# Patient Record
Sex: Female | Born: 1970 | Hispanic: Refuse to answer | Marital: Married
Health system: Midwestern US, Community
[De-identification: ages and names within clinical notes are randomized; demographics above are authoritative.]

## PROBLEM LIST (undated history)

## (undated) DIAGNOSIS — Z973 Presence of spectacles and contact lenses: Secondary | ICD-10-CM

## (undated) DIAGNOSIS — D649 Anemia, unspecified: Secondary | ICD-10-CM

## (undated) DIAGNOSIS — R011 Cardiac murmur, unspecified: Secondary | ICD-10-CM

## (undated) HISTORY — PX: LAPAROSCOPIC CHOLECYSTECTOMY: SUR755

## (undated) HISTORY — PX: SIGMOIDOSCOPY: SUR1295

## (undated) HISTORY — PX: UPPER GI ENDOSCOPY: SHX6162

## (undated) MED ORDER — CHOLESTYRAMINE-ASPARTAME 4 GRAM PACKET: 4 gram | PACK | Freq: Two times a day (BID) | ORAL | Status: AC

---

## 1999-08-25 ENCOUNTER — Encounter: Payer: Self-pay | Admitting: Occupational Medicine

## 1999-08-25 ENCOUNTER — Ambulatory Visit: Admission: RE | Admit: 1999-08-25 | Discharge: 1999-08-25 | Payer: Self-pay | Admitting: Occupational Medicine

## 1999-09-16 ENCOUNTER — Encounter: Admission: RE | Admit: 1999-09-16 | Discharge: 1999-09-29 | Payer: Self-pay | Admitting: Occupational Medicine

## 1999-11-02 ENCOUNTER — Other Ambulatory Visit: Admission: RE | Admit: 1999-11-02 | Discharge: 1999-11-02 | Payer: Self-pay | Admitting: Gynecology

## 2003-09-13 HISTORY — PX: HERNIA REPAIR: SHX51

## 2003-09-13 HISTORY — PX: UMBILICAL HERNIA REPAIR: SHX196

## 2013-06-07 NOTE — Progress Notes (Signed)
Karen Mitchell is a 42 y.o. female who presents with a   Chief Complaint   Patient presents with   ??? Pain Upper Quadrant   patient complains of right upper quadrant pain for the last 1 year.  Patient is status post laparoscopic cholecystectomy or 4 years ago since then patient has had increased bowel movements with some diarrhea up to 3 times a day at times.  She developed some right upper quadrant abdominal pain occasionally related to her food over last year or so that is intermittent in nature.  It is a dull ache.  She has recently only had 1 bowel movement per day and feels bloated at times.  She denies any heartburn.  She has no constipation or nausea or vomiting.  She has no weight loss.  She overall has a healthy diet does not drink alcohol or smoke cigarettes.    History reviewed. No pertinent past medical history.  Past Surgical History   Procedure Laterality Date   ??? Hx cholecystectomy       History     Social History   ??? Marital Status: MARRIED     Spouse Name: N/A     Number of Children: N/A   ??? Years of Education: N/A     Occupational History   ??? Not on file.     Social History Main Topics   ??? Smoking status: Never Smoker    ??? Smokeless tobacco: Not on file   ??? Alcohol Use: Not on file   ??? Drug Use: Not on file   ??? Sexually Active: Not on file     Other Topics Concern   ??? Not on file     Social History Narrative   ??? No narrative on file     History reviewed. No pertinent family history.  Current Outpatient Prescriptions   Medication Sig   ??? cholestyramine light (QUESTRAN LIGHT) 4 gram packet Take 1 packet by mouth two (2) times a day. As needed     No current facility-administered medications for this visit.     No Known Allergies    Review of Systems:Negative except for HPI.      Filed Vitals:    06/07/13 1130   BP: 110/70   Pulse: 69   Resp: 18   Height: 5\' 3"  (1.6 m)   Weight: 126 lb (57.153 kg)   SpO2: 98%     Body mass index is 22.33 kg/(m^2).    P/E    Psychiatric: Alert and Oriented to person,  place, and time, mood-euthymic, normal affect  Ear,Nose,Mouth,Throat:  Neck is supple, No adenopathy, TM's no pathology, oropharynx-no abnormalities  Lymphatic-No neck adenopathy, no supraclavicular adenopathy  Cardiovascular:  Regular, rate and rhythm, no murmurs, rubs or gallops, no lower extremity edema, no carotid bruits  Respiratory: normal respiratory effort,  Clear to ausculation bilateral  Abdomen: soft, + slight point tenderness with slight increased tissue lateral to laparoscopic incision, not distended, normoactive bowel sounds, no hepatomegaly, no splenomegaly  Musculoskelatal:normal gait and station, No clubbing cyanosis, or inflamatory conditions     A/P  Check ultrasound of abdomen to rule out any retained stones versus common bile duct.  Most likely adhesions from previous surgery causing pain if IS NEGATIVE REFER BACK TO HER SURGEON FOR FURTHER EVALUATION.  Diarrhea trial of Questran as needed  Orders Placed This Encounter   ??? [place] RETROPERITONEUM COMP [id number]     Standing Status: Future      Number of Occurrences:  Standing Expiration Date: 07/07/2014     Order Specific Question:  Reason for Exam     Answer:  ruq pain s/p lap chole 4 years ago     Order Specific Question:  Is Patient Pregnant?     Answer:  Unknown   ??? cholestyramine light (QUESTRAN LIGHT) 4 gram packet     Sig: Take 1 packet by mouth two (2) times a day. As needed     Dispense:  60 packet     Refill:  0     Follow-up Disposition:  Return in about 2 weeks (around 06/21/2013).  Shawn Stall, MD

## 2014-08-21 ENCOUNTER — Inpatient Hospital Stay
Admit: 2014-08-21 | Discharge: 2014-08-21 | Disposition: A | Discharge status: Home / Self Care | Payer: PRIVATE HEALTH INSURANCE | Attending: Internal Medicine

## 2014-08-21 ENCOUNTER — Emergency Department: Admit: 2014-08-21 | Payer: PRIVATE HEALTH INSURANCE | Primary: Internal Medicine

## 2014-08-21 DIAGNOSIS — R0600 Dyspnea, unspecified: Secondary | ICD-10-CM

## 2014-08-21 LAB — CBC WITH AUTOMATED DIFF
ABS. BASOPHILS: 0.1 10*3/uL (ref 0.0–0.1)
ABS. EOSINOPHILS: 0.1 10*3/uL (ref 0.0–0.7)
ABS. LYMPHOCYTES: 1.5 10*3/uL (ref 1.5–3.5)
ABS. MONOCYTES: 0.3 10*3/uL (ref 0.0–1.0)
ABS. NEUTROPHILS: 2.8 10*3/uL (ref 1.5–6.6)
BASOPHILS: 1 % (ref 0.0–3.0)
EOSINOPHILS: 1 % (ref 0.0–7.0)
HCT: 35.6 % — ABNORMAL LOW (ref 36.0–47.0)
HGB: 11.7 g/dL — ABNORMAL LOW (ref 12.0–16.0)
IMMATURE GRANULOCYTES: 0 % (ref 0.0–2.0)
LYMPHOCYTES: 32 % (ref 18.0–40.0)
MCH: 29.8 PG (ref 27.0–35.0)
MCHC: 32.9 g/dL (ref 30.7–37.3)
MCV: 90.6 FL (ref 81.0–94.0)
MONOCYTES: 6 % (ref 2.0–12.0)
MPV: 10.3 FL (ref 9.2–11.8)
NEUTROPHILS: 60 % (ref 48.0–72.0)
PLATELET: 324 10*3/uL (ref 130–400)
RBC: 3.93 M/uL — ABNORMAL LOW (ref 4.20–5.40)
RDW: 12.2 % (ref 11.5–14.0)
WBC: 4.7 10*3/uL — ABNORMAL LOW (ref 4.8–10.6)

## 2014-08-21 LAB — MAGNESIUM: Magnesium: 1.5 mg/dL — ABNORMAL LOW (ref 1.8–2.4)

## 2014-08-21 LAB — HCG URINE, QL: HCG urine, QL: NEGATIVE

## 2014-08-21 LAB — D DIMER: D-dimer: <500 ng/mL (ref <500)

## 2014-08-21 LAB — TROPONIN I: Troponin-I, Qt.: <0.02 NG/ML (ref 0.00–0.09)

## 2014-08-21 LAB — D-DIMER, QUANTITATIVE: D-Dimer, Quant: <500 ng/mL (ref <500)

## 2014-08-21 MED ORDER — ASPIRIN 81 MG CHEWABLE TAB
81 mg | ORAL | Status: DC
Start: 2014-08-21 — End: 2014-08-21

## 2014-08-21 NOTE — ED Notes (Signed)
Dr. Maudie Mercury in to evaluate the patient.

## 2014-08-21 NOTE — ED Notes (Signed)
Pt states the past few days her blood pressure has been elevated and SOB at times. Also today noted her rt arm was swollen and squeezing pain at times to arm. Pt sent in from urgent care for evaluation.

## 2014-08-21 NOTE — ED Provider Notes (Signed)
The history is provided by the patient.      2:04 PM: Karen Mitchell is a 43 y.o. female with no PMHx who presents to the ED c/o progressively worsening SOB when she walks over the past few days and reports elevated BP for the past week around 138/86 increased from her baseline 110/60. She also reports squeezing non-radiating R arm pain which began today. She was seen for her sx at an Urgent Care today where she had an EKG and she was sent here for abnormal findings. She admits to being under heavy stress at work. She denies CP, palpitations, N/V and reports no further complaints at this time.      History reviewed. No pertinent past medical history.    Past Surgical History:   Procedure Laterality Date   ??? Hx cholecystectomy           History reviewed. No pertinent family history.    History     Social History   ??? Marital Status: MARRIED     Spouse Name: N/A     Number of Children: N/A   ??? Years of Education: N/A     Occupational History   ??? Not on file.     Social History Main Topics   ??? Smoking status: Never Smoker    ??? Smokeless tobacco: Not on file   ??? Alcohol Use: Not on file   ??? Drug Use: Not on file   ??? Sexual Activity: Not on file     Other Topics Concern   ??? Not on file     Social History Narrative                ALLERGIES: Review of patient's allergies indicates no known allergies.      Review of Systems   Constitutional: Negative for fever and chills.   HENT: Negative for rhinorrhea and sore throat.    Eyes: Negative for photophobia and visual disturbance.   Respiratory: Positive for shortness of breath (when walking). Negative for cough.    Cardiovascular: Negative for chest pain, palpitations and leg swelling.   Gastrointestinal: Negative for nausea, vomiting, abdominal pain and diarrhea.   Genitourinary: Negative for dysuria and hematuria.   Musculoskeletal: Negative for myalgias and back pain.        Positive for R arm pain.   Skin: Negative for color change and pallor.    Neurological: Negative for seizures and syncope.       Filed Vitals:    08/21/14 1313 08/21/14 1434 08/21/14 1515 08/21/14 1530   BP: 153/88 121/72 114/69 124/65   Pulse: 105 83 79 82   Temp: 98.4 ??F (36.9 ??C)      Resp: 20 20 18 16    Height: 5' 3"  (1.6 m)      Weight: 58.968 kg (130 lb)      SpO2: 100% 100% 100% 100%        1:13 PM Pulse Oximetry reading is 100 % on room air, which indicates normal oxygenation per Vergia Alberts, MD.         Physical Exam   Constitutional: She is oriented to person, place, and time. She appears well-developed and well-nourished. No distress.   HENT:   Head: Normocephalic and atraumatic.   Eyes: EOM are normal. Pupils are equal, round, and reactive to light.   Neck: Neck supple. No JVD present.   Cardiovascular: Normal rate, regular rhythm and normal heart sounds.    Pulmonary/Chest: Effort normal and breath  sounds normal. No respiratory distress. She has no wheezes. She has no rales.   Abdominal: Soft. Bowel sounds are normal. She exhibits no distension. There is no tenderness. There is no rebound and no guarding.   Musculoskeletal: Normal range of motion. She exhibits no edema or tenderness.   Neurological: She is alert and oriented to person, place, and time.   Skin: Skin is warm and dry.   Psychiatric: She has a normal mood and affect.   Nursing note and vitals reviewed.       MDM  Number of Diagnoses or Management Options  Dyspnea:      Amount and/or Complexity of Data Reviewed  Clinical lab tests: ordered and reviewed  Tests in the radiology section of CPT??: reviewed and ordered  Decide to obtain previous medical records or to obtain history from someone other than the patient: yes  Review and summarize past medical records: yes  Independent visualization of images, tracings, or specimens: yes        Procedures      I, Vergia Alberts, MD, reviewed the patient's past history, allergies and home medications as documented in the nursing chart.    Labs:   Recent Results (from the past 12 hour(s))   EKG, 12 LEAD, INITIAL    Collection Time: 08/21/14  2:05 PM   Result Value Ref Range    Ventricular Rate 84 BPM    Atrial Rate 84 BPM    P-R Interval 149 ms    QRS Duration 98 ms    Q-T Interval 391 ms    QTC Calculation (Bezet) 462 ms    Calculated P Axis 88 degrees    Calculated R Axis 82 degrees    Calculated T Axis 27 degrees    Diagnosis       Sinus rhythm  Probable left atrial enlargement  Borderline T wave abnormalities     HCG URINE, QL    Collection Time: 08/21/14  2:15 PM   Result Value Ref Range    HCG urine, Ql. NEGATIVE  NEG     CBC WITH AUTOMATED DIFF    Collection Time: 08/21/14  2:30 PM   Result Value Ref Range    WBC 4.7 (L) 4.8 - 10.6 K/uL    RBC 3.93 (L) 4.20 - 5.40 M/uL    HGB 11.7 (L) 12.0 - 16.0 g/dL    HCT 35.6 (L) 36.0 - 47.0 %    MCV 90.6 81.0 - 94.0 FL    MCH 29.8 27.0 - 35.0 PG    MCHC 32.9 30.7 - 37.3 g/dL    RDW 12.2 11.5 - 14.0 %    PLATELET 324 130 - 400 K/uL    MPV 10.3 9.2 - 11.8 FL    NEUTROPHILS 60 48.0 - 72.0 %    LYMPHOCYTES 32 18.0 - 40.0 %    MONOCYTES 6 2.0 - 12.0 %    EOSINOPHILS 1 0.0 - 7.0 %    BASOPHILS 1 0.0 - 3.0 %    ABS. NEUTROPHILS 2.8 1.5 - 6.6 K/UL    ABS. LYMPHOCYTES 1.5 1.5 - 3.5 K/UL    ABS. MONOCYTES 0.3 0.0 - 1.0 K/UL    ABS. EOSINOPHILS 0.1 0.0 - 0.7 K/UL    ABS. BASOPHILS 0.1 0.0 - 0.1 K/UL    DF AUTOMATED      IMMATURE GRANULOCYTES 0.0 0.0 - 2.0 %   TROPONIN I    Collection Time: 08/21/14  2:30 PM   Result Value  Ref Range    Troponin-I, Qt. <0.02 0.00 - 0.09 NG/ML   MAGNESIUM    Collection Time: 08/21/14  2:30 PM   Result Value Ref Range    Magnesium 1.5 (L) 1.8 - 2.4 mg/dL   D DIMER    Collection Time: 08/21/14  2:30 PM   Result Value Ref Range    D-dimer <500 <500 ng/mL       EKG:  2:13 PM HR 84 NSR no acute ST changes  Interpreted by Vergia Alberts, MD    Imaging:   CXR Results  (Last 48 hours)               08/21/14 1507  XR CHEST PA LAT Final result    Impression:  IMPRESSION:    No focal infiltrate or effusion            Narrative:  History: chest pain       Technique: PA and Lateral views of the chest 08/21/2014 3:07 PM       Comparison: none.        FINDINGS:       No focal infiltrate or effusion is identified.    The cardiac silhouette is unremarkable.   The visualized osseous structures appear unremarkable.               Radiology reviewed by Vergia Alberts, MD       <EMERGENCY DEPARTMENT CASE SUMMARY>    Impression/Differential Diagnosis: anxiety vs dyspnea    ED Course:   Pt is a 43 y.o. female who presents to the ED for SOB when walking and R arm pain.  Order cardiac workup.   Labs and imaging reviewed. No acute pathology. Will discharge.  4:15 PM Patient was reassessed prior to discharge. I personally discussed test results and discharge plan with patient/guardian, who understands instructions.  All questions were answered. Patient/guardian advised to follow up with PMD and/or specialist.  Patient/guardian instructed to return to the ER if symptoms persist, change, or worsen.  Patient agrees with plan.    Final Impression/Diagnosis:   1. Dyspnea        Patient condition at time of disposition: Stable    I have reviewed the following home medications:    Prior to Admission medications    Medication Sig Start Date End Date Taking? Authorizing Provider   cholestyramine light (QUESTRAN LIGHT) 4 gram packet Take 1 packet by mouth two (2) times a day. As needed 06/07/13   Brunetta Genera, MD       Vergia Alberts, MD     I, Rande Brunt, am serving as a scribe to document services personally performed by Vergia Alberts, MD based on my observation and the provider's statements to me.    I, Vergia Alberts, MD attest that the person(s) noted above, acting as my scribe(s) noted above, has observed my performance of the services and has documented them in accordance with my direction. I have personally reviewed the above information and have ordered and reviewed the diagnostic studies, unless otherwise noted.

## 2014-08-21 NOTE — ED Notes (Signed)
I have reviewed discharge instructions with the patient.  The patient verbalized understanding.

## 2014-08-22 LAB — EKG, 12 LEAD, INITIAL
Atrial Rate: 84 {beats}/min
Calculated P Axis: 88 degrees
Calculated R Axis: 82 degrees
Calculated T Axis: 27 degrees
P-R Interval: 149 ms
Q-T Interval: 391 ms
QRS Duration: 98 ms
QTC Calculation (Bezet): 462 ms
Ventricular Rate: 84 {beats}/min

## 2017-01-13 ENCOUNTER — Ambulatory Visit (INDEPENDENT_AMBULATORY_CARE_PROVIDER_SITE_OTHER): Payer: No Typology Code available for payment source | Admitting: Obstetrics & Gynecology

## 2017-01-13 ENCOUNTER — Encounter (HOSPITAL_COMMUNITY): Payer: Self-pay

## 2017-01-13 ENCOUNTER — Encounter: Payer: Self-pay | Admitting: Obstetrics & Gynecology

## 2017-01-13 VITALS — BP 136/80 | HR 79 | Ht 62.0 in | Wt 129.0 lb

## 2017-01-13 DIAGNOSIS — N920 Excessive and frequent menstruation with regular cycle: Secondary | ICD-10-CM | POA: Diagnosis not present

## 2017-01-13 NOTE — Progress Notes (Signed)
Here today for evaluation of rt ovarian cyst and fibroids.  Last pap smear 2016.  Just moved here from [place]. No history of any abnormal pap smears.

## 2017-01-13 NOTE — Progress Notes (Signed)
   Subjective:    Patient ID: Erika Sloan, female    DOB: Jan 24, 1971, 46 y.o.   MRN: [id number]  HPI 46 yo M AAP3 (25, 56, and 33 yo kids) here today with u/s findings of a right ovarian cyst of 4.2 cm, thought to be a follicle, and a 2.9 cm on the left ovary. U/ s also showed 2 fibroids, one 2 cm and one 3.8 cm. She has right leg numbness, starting with the first day of her period for the last about 3 years. Her periods are very painful, passes clots for about 3 years. She is taking Naproxyn, helps. IBU 450m helps a little. She has some pain with sex, postitional, not all the time. Her husband has a vasectomy. She has to wear the overnight pads during the day, even due to the heaviness of her periods.  Review of Systems [place] Pap was 2 years ago and normal TSH was normal in the last year.   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 Objective:   Physical Exam Pleasant [id number] Breathing, conversing, and ambulating normally Abd- benign Bimanual exam- 8 week size mobile, NT uterus, no adnexal masses Excellent pubic arch if she needs a TVH     Assessment & Plan:  Heavy periods with clots- check CBC, she takes iron, always anemic Severe dysmenorrhea- offered emperic treatment for endometriosis versus diagnostic laparoscopy. She would like a definitive diagnosis so I will send JJordana message to schedule this.

## 2017-01-14 LAB — CBC
Hematocrit: 34.2 % (ref 34.0–46.6)
Hemoglobin: 11.4 g/dL (ref 11.1–15.9)
MCH: 30.5 pg (ref 26.6–33.0)
MCHC: 33.3 g/dL (ref 31.5–35.7)
MCV: 91 fL (ref 79–97)
Platelets: 326 10*3/uL (ref 150–379)
RBC: 3.74 x10E6/uL — ABNORMAL LOW (ref 3.77–5.28)
RDW: 12.8 % (ref 12.3–15.4)
WBC: 3.8 10*3/uL (ref 3.4–10.8)

## 2017-02-02 NOTE — Patient Instructions (Signed)
Your procedure is scheduled on:  Wednesday, February 15, 2017  Enter through the [employer] [facility] at:  7:00 AM  Pick up the phone at the desk and dial [phone].  Call this number if you have problems the morning of surgery: [phone].  Remember: Do NOT eat food or drink after:  Midnight Tuesday  Take these medicines the morning of surgery with a SIP OF WATER:  None  Stop ALL herbal medications and Vitamin C at this time  Do NOT smoke the day of surgery.  Do NOT wear jewelry (body piercing), metal hair clips/bobby pins, make-up, or nail polish. Do NOT wear lotions, powders, or perfumes.  You may wear deodorant. Do NOT shave for 48 hours prior to surgery. Do NOT bring valuables to the hospital. Contacts, dentures, or bridgework may not be worn into surgery.  Have a responsible adult drive you home and stay with you for 24 hours after your procedure  Bring a copy of your healthcare power of attorney and living will documents.

## 2017-02-03 ENCOUNTER — Encounter (HOSPITAL_COMMUNITY)
Admission: RE | Admit: 2017-02-03 | Discharge: 2017-02-03 | Disposition: A | Discharge status: Home / Self Care | Payer: Commercial Managed Care - HMO | Source: Ambulatory Visit | Attending: Obstetrics & Gynecology | Admitting: Obstetrics & Gynecology

## 2017-02-03 ENCOUNTER — Encounter (HOSPITAL_COMMUNITY): Payer: Self-pay

## 2017-02-03 DIAGNOSIS — Z01812 Encounter for preprocedural laboratory examination: Secondary | ICD-10-CM | POA: Diagnosis not present

## 2017-02-03 HISTORY — DX: Anemia, unspecified: D64.9

## 2017-02-03 HISTORY — DX: Cardiac murmur, unspecified: R01.1

## 2017-02-03 LAB — CBC
HCT: 35.9 % — ABNORMAL LOW (ref 36.0–46.0)
Hemoglobin: 12.1 g/dL (ref 12.0–15.0)
MCH: 30.3 pg (ref 26.0–34.0)
MCHC: 33.7 g/dL (ref 30.0–36.0)
MCV: 90 fL (ref 78.0–100.0)
Platelets: 325 10*3/uL (ref 150–400)
RBC: 3.99 MIL/uL (ref 3.87–5.11)
RDW: 13 % (ref 11.5–15.5)
WBC: 5.5 10*3/uL (ref 4.0–10.5)

## 2017-02-15 ENCOUNTER — Ambulatory Visit (HOSPITAL_COMMUNITY)
Admission: RE | Admit: 2017-02-15 | Discharge: 2017-02-15 | Disposition: A | Discharge status: Home / Self Care | Payer: Commercial Managed Care - HMO | Source: Ambulatory Visit | Attending: Obstetrics & Gynecology | Admitting: Obstetrics & Gynecology

## 2017-02-15 ENCOUNTER — Ambulatory Visit (HOSPITAL_COMMUNITY): Payer: Commercial Managed Care - HMO | Admitting: Anesthesiology

## 2017-02-15 ENCOUNTER — Encounter (HOSPITAL_COMMUNITY)
Admission: RE | Disposition: A | Discharge status: Home / Self Care | Payer: Self-pay | Source: Ambulatory Visit | Attending: Obstetrics & Gynecology

## 2017-02-15 ENCOUNTER — Encounter (HOSPITAL_COMMUNITY): Payer: Self-pay | Admitting: *Deleted

## 2017-02-15 DIAGNOSIS — Z8249 Family history of ischemic heart disease and other diseases of the circulatory system: Secondary | ICD-10-CM | POA: Diagnosis not present

## 2017-02-15 DIAGNOSIS — R102 Pelvic and perineal pain: Secondary | ICD-10-CM | POA: Insufficient documentation

## 2017-02-15 DIAGNOSIS — N938 Other specified abnormal uterine and vaginal bleeding: Secondary | ICD-10-CM | POA: Diagnosis present

## 2017-02-15 DIAGNOSIS — N946 Dysmenorrhea, unspecified: Secondary | ICD-10-CM | POA: Insufficient documentation

## 2017-02-15 DIAGNOSIS — Z9049 Acquired absence of other specified parts of digestive tract: Secondary | ICD-10-CM | POA: Diagnosis not present

## 2017-02-15 DIAGNOSIS — N920 Excessive and frequent menstruation with regular cycle: Secondary | ICD-10-CM | POA: Insufficient documentation

## 2017-02-15 DIAGNOSIS — Z885 Allergy status to narcotic agent status: Secondary | ICD-10-CM | POA: Insufficient documentation

## 2017-02-15 DIAGNOSIS — N8500 Endometrial hyperplasia, unspecified: Secondary | ICD-10-CM | POA: Insufficient documentation

## 2017-02-15 DIAGNOSIS — Z803 Family history of malignant neoplasm of breast: Secondary | ICD-10-CM | POA: Diagnosis not present

## 2017-02-15 DIAGNOSIS — D649 Anemia, unspecified: Secondary | ICD-10-CM | POA: Insufficient documentation

## 2017-02-15 DIAGNOSIS — D250 Submucous leiomyoma of uterus: Secondary | ICD-10-CM

## 2017-02-15 DIAGNOSIS — Z9889 Other specified postprocedural states: Secondary | ICD-10-CM | POA: Diagnosis not present

## 2017-02-15 HISTORY — PX: DILITATION & CURRETTAGE/HYSTROSCOPY WITH HYDROTHERMAL ABLATION: SHX5570

## 2017-02-15 HISTORY — PX: LAPAROSCOPY: SHX197

## 2017-02-15 LAB — PREGNANCY, URINE: Preg Test, Ur: NEGATIVE

## 2017-02-15 SURGERY — LAPAROSCOPY OPERATIVE
Anesthesia: General

## 2017-02-15 MED ORDER — SUGAMMADEX SODIUM 200 MG/2ML IV SOLN
INTRAVENOUS | Status: DC | PRN
  Administered 2017-02-15: 119 mg via INTRAVENOUS

## 2017-02-15 MED ORDER — SCOPOLAMINE 1 MG/3DAYS TD PT72
MEDICATED_PATCH | TRANSDERMAL | Status: AC
  Filled 2017-02-15: qty 1

## 2017-02-15 MED ORDER — LIDOCAINE HCL (CARDIAC) 20 MG/ML IV SOLN
INTRAVENOUS | Status: AC
  Filled 2017-02-15: qty 5

## 2017-02-15 MED ORDER — FLUMAZENIL 0.5 MG/5ML IV SOLN
INTRAVENOUS | Status: DC | PRN
  Administered 2017-02-15: 0.2 mg via INTRAVENOUS

## 2017-02-15 MED ORDER — IBUPROFEN 600 MG PO TABS: 600.0000 mg | ORAL_TABLET | Freq: Four times a day (QID) | ORAL | 1 refills | Status: DC | PRN

## 2017-02-15 MED ORDER — PROMETHAZINE HCL 25 MG/ML IJ SOLN
INTRAMUSCULAR | Status: AC
  Filled 2017-02-15: qty 1

## 2017-02-15 MED ORDER — BUPIVACAINE HCL (PF) 0.5 % IJ SOLN
INTRAMUSCULAR | Status: AC
Start: 2017-02-15 — End: ?
  Filled 2017-02-15: qty 30

## 2017-02-15 MED ORDER — FLUMAZENIL 0.5 MG/5ML IV SOLN
INTRAVENOUS | Status: AC
  Filled 2017-02-15: qty 5

## 2017-02-15 MED ORDER — DEXAMETHASONE SODIUM PHOSPHATE 10 MG/ML IJ SOLN
INTRAMUSCULAR | Status: DC | PRN
  Administered 2017-02-15: 4 mg via INTRAVENOUS

## 2017-02-15 MED ORDER — BUPIVACAINE HCL (PF) 0.5 % IJ SOLN
INTRAMUSCULAR | Status: AC
  Filled 2017-02-15: qty 30

## 2017-02-15 MED ORDER — SUGAMMADEX SODIUM 200 MG/2ML IV SOLN
INTRAVENOUS | Status: AC
  Filled 2017-02-15: qty 2

## 2017-02-15 MED ORDER — SODIUM CHLORIDE 0.9 % IR SOLN
Status: DC | PRN
  Administered 2017-02-15: 3000 mL

## 2017-02-15 MED ORDER — KETOROLAC TROMETHAMINE 30 MG/ML IJ SOLN
INTRAMUSCULAR | Status: DC | PRN
  Administered 2017-02-15: 30 mg via INTRAVENOUS

## 2017-02-15 MED ORDER — ONDANSETRON HCL 4 MG/2ML IJ SOLN
INTRAMUSCULAR | Status: AC
  Filled 2017-02-15: qty 2

## 2017-02-15 MED ORDER — PROPOFOL 10 MG/ML IV BOLUS
INTRAVENOUS | Status: AC
  Filled 2017-02-15: qty 20

## 2017-02-15 MED ORDER — OXYCODONE-ACETAMINOPHEN 5-325 MG PO TABS: 1.0000 | ORAL_TABLET | Freq: Four times a day (QID) | ORAL | 0 refills | Status: DC | PRN

## 2017-02-15 MED ORDER — KETOROLAC TROMETHAMINE 30 MG/ML IJ SOLN
INTRAMUSCULAR | Status: AC
  Filled 2017-02-15: qty 1

## 2017-02-15 MED ORDER — ROCURONIUM BROMIDE 100 MG/10ML IV SOLN
INTRAVENOUS | Status: DC | PRN
  Administered 2017-02-15: 35 mg via INTRAVENOUS

## 2017-02-15 MED ORDER — PROPOFOL 10 MG/ML IV BOLUS
INTRAVENOUS | Status: DC | PRN
  Administered 2017-02-15: 180 mg via INTRAVENOUS

## 2017-02-15 MED ORDER — LIDOCAINE HCL (CARDIAC) 20 MG/ML IV SOLN
INTRAVENOUS | Status: DC | PRN
  Administered 2017-02-15: 30 mg via INTRAVENOUS
  Administered 2017-02-15: 70 mg via INTRAVENOUS

## 2017-02-15 MED ORDER — BUPIVACAINE HCL (PF) 0.5 % IJ SOLN
INTRAMUSCULAR | Status: DC | PRN
  Administered 2017-02-15: 14 mL

## 2017-02-15 MED ORDER — DEXAMETHASONE SODIUM PHOSPHATE 4 MG/ML IJ SOLN
INTRAMUSCULAR | Status: AC
  Filled 2017-02-15: qty 1

## 2017-02-15 MED ORDER — SCOPOLAMINE 1 MG/3DAYS TD PT72
1.0000 | MEDICATED_PATCH | Freq: Once | TRANSDERMAL | Status: DC
  Administered 2017-02-15: 1.5 mg via TRANSDERMAL

## 2017-02-15 MED ORDER — MIDAZOLAM HCL 2 MG/2ML IJ SOLN
INTRAMUSCULAR | Status: DC | PRN
  Administered 2017-02-15: 1 mg via INTRAVENOUS

## 2017-02-15 MED ORDER — HYDROMORPHONE HCL 1 MG/ML IJ SOLN
0.2500 mg | INTRAMUSCULAR | Status: DC | PRN
  Administered 2017-02-15: 0.5 mg via INTRAVENOUS
  Administered 2017-02-15 (×2): 0.25 mg via INTRAVENOUS

## 2017-02-15 MED ORDER — HYDROMORPHONE HCL 1 MG/ML IJ SOLN
INTRAMUSCULAR | Status: AC
  Filled 2017-02-15: qty 1

## 2017-02-15 MED ORDER — ONDANSETRON HCL 4 MG/2ML IJ SOLN
INTRAMUSCULAR | Status: DC | PRN
  Administered 2017-02-15: 4 mg via INTRAVENOUS

## 2017-02-15 MED ORDER — ROCURONIUM BROMIDE 100 MG/10ML IV SOLN
INTRAVENOUS | Status: AC
Start: 2017-02-15 — End: ?
  Filled 2017-02-15: qty 1

## 2017-02-15 MED ORDER — MEPERIDINE HCL 25 MG/ML IJ SOLN: 6.2500 mg | INTRAMUSCULAR | Status: DC | PRN

## 2017-02-15 MED ORDER — MIDAZOLAM HCL 2 MG/2ML IJ SOLN: 0.5000 mg | Freq: Once | INTRAMUSCULAR | Status: DC | PRN

## 2017-02-15 MED ORDER — LACTATED RINGERS IV SOLN
INTRAVENOUS | Status: DC
  Administered 2017-02-15: 1000 mL via INTRAVENOUS
  Administered 2017-02-15: 09:00:00 via INTRAVENOUS

## 2017-02-15 MED ORDER — PROMETHAZINE HCL 25 MG/ML IJ SOLN
6.2500 mg | INTRAMUSCULAR | Status: DC | PRN
Start: 2017-02-15 — End: 2017-02-15
  Administered 2017-02-15: 6.25 mg via INTRAVENOUS

## 2017-02-15 MED ORDER — FENTANYL CITRATE (PF) 250 MCG/5ML IJ SOLN
INTRAMUSCULAR | Status: AC
  Filled 2017-02-15: qty 5

## 2017-02-15 MED ORDER — MIDAZOLAM HCL 2 MG/2ML IJ SOLN
INTRAMUSCULAR | Status: AC
  Filled 2017-02-15: qty 2

## 2017-02-15 MED ORDER — FENTANYL CITRATE (PF) 100 MCG/2ML IJ SOLN
INTRAMUSCULAR | Status: DC | PRN
  Administered 2017-02-15 (×2): 50 ug via INTRAVENOUS

## 2017-02-15 SURGICAL SUPPLY — 43 items
APPLICATOR COTTON TIP 6IN STRL (MISCELLANEOUS) ×2 IMPLANT
BAG SPEC RTRVL LRG 6X4 10 (ENDOMECHANICALS)
CABLE HIGH FREQUENCY MONO STRZ (ELECTRODE) IMPLANT
CANISTER SUCT 3000ML PPV (MISCELLANEOUS) ×2 IMPLANT
CLOTH BEACON ORANGE TIMEOUT ST (SAFETY) ×2 IMPLANT
CONTAINER PREFILL 10% NBF 60ML (FORM) ×4 IMPLANT
DILATOR CANAL MILEX (MISCELLANEOUS) IMPLANT
DRSG COVADERM PLUS 2X2 (GAUZE/BANDAGES/DRESSINGS) ×2 IMPLANT
DRSG OPSITE POSTOP 3X4 (GAUZE/BANDAGES/DRESSINGS) ×1 IMPLANT
DURAPREP 26ML APPLICATOR (WOUND CARE) ×2 IMPLANT
ELECT REM PT RETURN 9FT ADLT (ELECTROSURGICAL)
ELECTRODE REM PT RTRN 9FT ADLT (ELECTROSURGICAL) IMPLANT
GLOVE BIO SURGEON STRL SZ 6.5 (GLOVE) ×4 IMPLANT
GLOVE BIOGEL PI IND STRL 7.0 (GLOVE) ×2 IMPLANT
GLOVE BIOGEL PI INDICATOR 7.0 (GLOVE) ×2
GOWN STRL REUS W/TWL LRG LVL3 (GOWN DISPOSABLE) ×4 IMPLANT
NDL SAFETY ECLIPSE 18X1.5 (NEEDLE) ×1 IMPLANT
NDL SPNL 18GX3.5 QUINCKE PK (NEEDLE) ×1 IMPLANT
NEEDLE HYPO 18GX1.5 SHARP (NEEDLE) ×2
NEEDLE INSUFFLATION 120MM (ENDOMECHANICALS) ×2 IMPLANT
NEEDLE SPNL 18GX3.5 QUINCKE PK (NEEDLE) ×2 IMPLANT
NS IRRIG 1000ML POUR BTL (IV SOLUTION) ×2 IMPLANT
PACK LAPAROSCOPY BASIN (CUSTOM PROCEDURE TRAY) ×2 IMPLANT
PACK TRENDGUARD 450 HYBRID PRO (MISCELLANEOUS) IMPLANT
PACK TRENDGUARD 600 HYBRD PROC (MISCELLANEOUS) IMPLANT
PACK VAGINAL MINOR WOMEN LF (CUSTOM PROCEDURE TRAY) ×2 IMPLANT
PAD OB MATERNITY 4.3X12.25 (PERSONAL CARE ITEMS) ×2 IMPLANT
POUCH SPECIMEN RETRIEVAL 10MM (ENDOMECHANICALS) IMPLANT
PROTECTOR NERVE ULNAR (MISCELLANEOUS) ×4 IMPLANT
SET GENESYS HTA PROCERVA (MISCELLANEOUS) IMPLANT
SET IRRIG TUBING LAPAROSCOPIC (IRRIGATION / IRRIGATOR) IMPLANT
SHEARS HARMONIC ACE PLUS 36CM (ENDOMECHANICALS) IMPLANT
SLEEVE XCEL OPT CAN 5 100 (ENDOMECHANICALS) ×1 IMPLANT
STRIP CLOSURE SKIN 1/2X4 (GAUZE/BANDAGES/DRESSINGS) ×2 IMPLANT
SUT VICRYL 0 UR6 27IN ABS (SUTURE) IMPLANT
SUT VICRYL 4-0 PS2 18IN ABS (SUTURE) ×2 IMPLANT
SYR 30ML LL (SYRINGE) ×2 IMPLANT
TOWEL OR 17X24 6PK STRL BLUE (TOWEL DISPOSABLE) ×4 IMPLANT
TRENDGUARD 450 HYBRID PRO PACK (MISCELLANEOUS) ×2
TRENDGUARD 600 HYBRID PROC PK (MISCELLANEOUS)
TROCAR OPTI TIP 5M 100M (ENDOMECHANICALS) ×2 IMPLANT
TROCAR XCEL NON-BLD 11X100MML (ENDOMECHANICALS) IMPLANT
WARMER LAPAROSCOPE (MISCELLANEOUS) ×2 IMPLANT

## 2017-02-15 NOTE — Op Note (Addendum)
02/15/2017  9:25 AM  PATIENT:  Erika Sloan  46 y.o. female  PRE-OPERATIVE DIAGNOSIS:  ChronicPelvic Pain, menorrhagia DUB  POST-OPERATIVE DIAGNOSIS: same + fibroid and probable stage 1 endometriosis DUB  PROCEDURE:  Procedure(s): LAPAROSCOPY OPERATIVE WITH PERITONEAL BIOPSY (N/A) DILATATION & CURETTAGE/HYSTEROSCOPY WITH HYDROTHERMAL ABLATION (N/A)  SURGEON:  Surgeon(s) and Role:    * Markea Ruzich C, MD - Primary   ANESTHESIA:   local and general  EBL:  Total I/O In: 900 [I.V.:900] Out: 30 [Urine:20; Blood:10]  BLOOD ADMINISTERED:none  DRAINS: none   LOCAL MEDICATIONS USED:  MARCAINE     SPECIMEN:  Source of Specimen:  peritoneal biopsy, endometrial curettings  DISPOSITION OF SPECIMEN:  PATHOLOGY  COUNTS:  YES  TOURNIQUET:  * No tourniquets in log *  DICTATION: .Dragon Dictation  PLAN OF CARE: Discharge to home after PACU  PATIENT DISPOSITION:  PACU - hemodynamically stable.   Delay start of Pharmacological VTE agent (>24hrs) due to surgical blood loss or risk of bleeding: not applicable   The risks, benefits, alternatives of surgery were explained understood, accepted. All questions were answered.  Her urine pregnancy test was negative. She was taken to the operating room and general anesthesia was applied without complication. She was placed in dorsal lithotomy position. Her abdomen and vagina were prepped and draped in the usual sterile fashion. A time out procedure was done. A bimanual exam revealed a ULN size and shape mobile uterus with nonenlarged adnexa. A Hulka manipulator was placed on the cervix. Gloves were changed and attention was turned to the abdomen. Approximately 10 mL of 0.5% Marcaine was used to infiltrate the subcutaneous tissue at the umbilicus. A vertical 5 mm incision was made. A Veres needle was placed in the pelvis. Low-flow CO2 was used to insufflate the abdomen to approximately 3 L. After good pneumoperitoneum was established, a 5 mm trocar was  placed. Laparoscopy confirmed correct placement. She was placed in the Trendelenburg position. Patient abdominal pressure was always less than 15. Her uterus ovaries and tubes appeared normal with the exception of a small pedunculated fundal fibroid. I suspect that there is also a 3 cm intramural fibroid. The upper abdomen appeared normal. I inspected the pelvis in an alpha manner. Both adnexa were normal. The only site that was abnormal was in the posterior cul de sac on the left where I saw 2 white lesions. I removed these both with the biopsy forceps. The specimens were very tiny and were sent to pathology.  There was no bleeding. The CO2 was allowed to escape from her abdomen. The subcuticular closure was done with 4-0 Vicryl suture. The Hulka manipulator was removed. I then proceeded with the HTA A speculum was placed and a single-tooth tenaculum was used to grasp the anterior lip of her cervix. A total of 20 mL of 0.5% Marcaine was used to perform a paracervical block. Her uterus sounded to 9 cm. Her cervix was carefully and slowly dilated to accommodate a small curette. A curettage was done in all quadrants and the fundus of the uterus. A moderate amount of tissue was obtained.  A gritty sensation was appreciated throughout. I then placed the HTA device. Hysteroscopy showed a shaggy endometrium. The device passed its test and ran for 10 minutes. After the cool down period, it was removed. There was no bleeding noted at the end of the case. She was taken to the recovery room after being extubated. She tolerated the procedure well.

## 2017-02-15 NOTE — Anesthesia Postprocedure Evaluation (Signed)
Anesthesia Post Note  Patient: Erika Sloan  Procedure(s) Performed: Procedure(s) (LRB): LAPAROSCOPY OPERATIVE WITH PERITONEAL BIOPSY (N/A) DILATATION & CURETTAGE/HYSTEROSCOPY WITH HYDROTHERMAL ABLATION (N/A)     Patient location during evaluation: PACU Anesthesia Type: General Level of consciousness: awake and alert, sedated and patient cooperative Pain management: pain level controlled Vital Signs Assessment: post-procedure vital signs reviewed and stable Respiratory status: spontaneous breathing, nonlabored ventilation and respiratory function stable Cardiovascular status: blood pressure returned to baseline and stable Postop Assessment: no signs of nausea or vomiting Anesthetic complications: no    Last Vitals:  Vitals:   02/15/17 0914 02/15/17 0930  BP: 122/82 (!) 112/99  Pulse: (!) 55 63  Resp: 16 15  Temp:      Last Pain:  Vitals:   02/15/17 0930  TempSrc:   PainSc: 5    Pain Goal: Patients Stated Pain Goal: 4 (02/15/17 3704)               Seleta Rhymes. Kita Neace

## 2017-02-15 NOTE — Transfer of Care (Signed)
Immediate Anesthesia Transfer of Care Note  Patient: Erika Sloan  Procedure(s) Performed: Procedure(s): LAPAROSCOPY OPERATIVE WITH PERITONEAL BIOPSY (N/A) DILATATION & CURETTAGE/HYSTEROSCOPY WITH HYDROTHERMAL ABLATION (N/A)  Patient Location: PACU  Anesthesia Type:General  Level of Consciousness: awake, alert , oriented and patient cooperative  Airway & Oxygen Therapy: Patient Spontanous Breathing and Patient connected to nasal cannula oxygen  Post-op Assessment: Report given to RN and Post -op Vital signs reviewed and stable  Post vital signs: Reviewed and stable  Last Vitals:  Vitals:   02/15/17 0611  BP: 115/80  Pulse: (!) 55  Resp: 16  Temp: 36.6 C    Last Pain:  Vitals:   02/15/17 0611  TempSrc: Oral  PainSc: 0-No pain      Patients Stated Pain Goal: 4 (15/94/58 5929)  Complications: No apparent anesthesia complications

## 2017-02-15 NOTE — Discharge Instructions (Addendum)
DISCHARGE INSTRUCTIONS: Laparoscopy  The following instructions have been prepared to help you care for yourself upon your return home today.  Wound care:  Do not get the incision wet for the first 24 hours. The incision should be kept clean and dry.  The Band-Aids or dressings may be removed the day after surgery.  Should the incision become sore, red, and swollen after the first week, check with your doctor.  Personal hygiene:  Shower the day after your procedure.  Activity and limitations:  Do NOT drive or operate any equipment today.  Do NOT lift anything more than 15 pounds for 2-3 weeks after surgery.  Do NOT rest in bed all day.  Walking is encouraged. Walk each day, starting slowly with 5-minute walks 3 or 4 times a day. Slowly increase the length of your walks.  Walk up and down stairs slowly.  Do NOT do strenuous activities, such as golfing, playing tennis, bowling, running, biking, weight lifting, gardening, mowing, or vacuuming for 2-4 weeks. Ask your doctor when it is okay to start.  Diet: Eat a light meal as desired this evening. You may resume your usual diet tomorrow.  Return to work: This is dependent on the type of work you do. For the most part you can return to a desk job within a week of surgery. If you are more active at work, please discuss this with your doctor.  What to expect after your surgery: You may have a slight burning sensation when you urinate on the first day. You may have a very small amount of blood in the urine. Expect to have a small amount of vaginal discharge/light bleeding for 1-2 weeks. It is not unusual to have abdominal soreness and bruising for up to 2 weeks. You may be tired and need more rest for about 1 week. You may experience shoulder pain for 24-72 hours. Lying flat in bed may relieve it.  Call your doctor for any of the following:  Develop a fever of 100.4 or greater  Inability to urinate 6 hours after discharge from  hospital  Severe pain not relieved by pain medications  Persistent of heavy bleeding at incision site  Redness or swelling around incision site after a week  Increasing nausea or vomiting  Patient Signature________________________________________ Nurse Signature_________________________________________  DISCHARGE INSTRUCTIONS: HYSTEROSCOPY / ENDOMETRIAL ABLATION The following instructions have been prepared to help you care for yourself upon your return home.  May Remove Scop patch on or before: Friday, June 9th   May take Ibuprofen after: 2:00 pm today  May take stool softner while taking narcotic pain medication to prevent constipation.  Drink plenty of water.  Personal hygiene:  Use sanitary pads for vaginal drainage, not tampons.  Shower the day after your procedure.  NO tub baths, pools or Jacuzzis for 2-3 weeks.  Wipe front to back after using the bathroom.  Activity and limitations:  Do NOT drive or operate any equipment for 24 hours. The effects of anesthesia are still present and drowsiness may result.  Do NOT rest in bed all day.  Walking is encouraged.  Walk up and down stairs slowly.  You may resume your normal activity in one to two days or as indicated by your physician. Sexual activity: NO intercourse for at least 2 weeks after the procedure, or as indicated by your Doctor.  Diet: Eat a light meal as desired this evening. You may resume your usual diet tomorrow.  Return to Work: You may resume your work  activities in one to two days or as indicated by your Doctor.  What to expect after your surgery: Expect to have vaginal bleeding/discharge for 2-3 days and spotting for up to 10 days. It is not unusual to have soreness for up to 1-2 weeks. You may have a slight burning sensation when you urinate for the first day. Mild cramps may continue for a couple of days. You may have a regular period in 2-6 weeks.  Call your doctor for any of the  following:  Excessive vaginal bleeding or clotting, saturating and changing one pad every hour.  Inability to urinate 6 hours after discharge from hospital.  Pain not relieved by pain medication.  Fever of 100.4 F or greater.  Unusual vaginal discharge or odor.  Return to office _________________Call for an appointment ___________________ Patients signature: ______________________ Nurses signature ________________________  Post Anesthesia Care Unit [phone]   Post Anesthesia Home Care Instructions  Activity: Get plenty of rest for the remainder of the day. A responsible individual must stay with you for 24 hours following the procedure.  For the next 24 hours, DO NOT: -Drive a car -Paediatric nurse -Drink alcoholic beverages -Take any medication unless instructed by your physician -Make any legal decisions or sign important papers.  Meals: Start with liquid foods such as gelatin or soup. Progress to regular foods as tolerated. Avoid greasy, spicy, heavy foods. If nausea and/or vomiting occur, drink only clear liquids until the nausea and/or vomiting subsides. Call your physician if vomiting continues.  Special Instructions/Symptoms: Your throat may feel dry or sore from the anesthesia or the breathing tube placed in your throat during surgery. If this causes discomfort, gargle with warm salt water. The discomfort should disappear within 24 hours.  If you had a scopolamine patch placed behind your ear for the management of post- operative nausea and/or vomiting:  1. The medication in the patch is effective for 72 hours, after which it should be removed.  Wrap patch in a tissue and discard in the trash. Wash hands thoroughly with soap and water. 2. You may remove the patch earlier than 72 hours if you experience unpleasant side effects which may include dry mouth, dizziness or visual disturbances. 3. Avoid touching the patch. Wash your hands with soap and water after  contact with the patch.

## 2017-02-15 NOTE — Anesthesia Preprocedure Evaluation (Signed)
Anesthesia Evaluation  Patient identified by MRN, date of birth, ID band Patient awake    Reviewed: Allergy & Precautions, NPO status , Patient's Chart, lab work & pertinent test results  History of Anesthesia Complications Negative for: history of anesthetic complications  Airway Mallampati: I   Neck ROM: Full    Dental  (+) Dental Advisory Given   Pulmonary neg pulmonary ROS,    breath sounds clear to auscultation       Cardiovascular (-) hypertensionnegative cardio ROS   Rhythm:Regular Rate:Normal     Neuro/Psych negative neurological ROS     GI/Hepatic negative GI ROS, Neg liver ROS,   Endo/Other  negative endocrine ROS  Renal/GU negative Renal ROS     Musculoskeletal   Abdominal   Peds  Hematology negative hematology ROS (+)   Anesthesia Other Findings   Reproductive/Obstetrics                             Anesthesia Physical Anesthesia Plan  ASA: I  Anesthesia Plan: General   Post-op Pain Management:    Induction: Intravenous  PONV Risk Score and Plan: 3 and Ondansetron, Dexamethasone, Propofol and Midazolam  Airway Management Planned: Oral ETT  Additional Equipment:   Intra-op Plan:   Post-operative Plan: Extubation in OR  Informed Consent: I have reviewed the patients History and Physical, chart, labs and discussed the procedure including the risks, benefits and alternatives for the proposed anesthesia with the patient or authorized representative who has indicated his/her understanding and acceptance.   Dental advisory given  Plan Discussed with: CRNA and Surgeon  Anesthesia Plan Comments: (Plan routine monitors, GETA)        Anesthesia Quick Evaluation

## 2017-02-15 NOTE — H&P (Signed)
46 yo M AAP3 (25, 62, and 72 yo kids) here today with u/s findings of a right ovarian cyst of 4.2 cm, thought to be a follicle, and a 2.9 cm on the left ovary. U/ s also showed 2 fibroids, one 2 cm and one 3.8 cm. She has right leg numbness, starting with the first day of her period for the last about 3 years. Her periods are very painful, passes clots for about 3 years. She is taking Naproxyn, helps. IBU 425m helps a little. She has some pain with sex, postitional, not all the time. Her husband has a vasectomy. She has to wear the overnight pads during the day, even due to the heaviness of her periods.  Patient's last menstrual period was 02/03/2017 (exact date).    Past Medical History:  Diagnosis Date  . Anemia   . Heart murmur    history of    Past Surgical History:  Procedure Laterality Date  . CHOLECYSTECTOMY    . GALLBLADDER SURGERY  2010  . HERNIA REPAIR  2005  . SIGMOIDOSCOPY    . UPPER GI ENDOSCOPY      Family History  Problem Relation Age of Onset  . Breast cancer Mother   . Mitral valve prolapse Mother     Social History:  reports that she has never smoked. She has never used smokeless tobacco. She reports that she drinks about 3.0 oz of alcohol per week . She reports that she does not use drugs.  Allergies:  Allergies  Allergen Reactions  . Percocet [Oxycodone-Acetaminophen] Itching    Prescriptions Prior to Admission  Medication Sig Dispense Refill Last Dose  . b complex vitamins tablet Take 1 tablet by mouth daily.   02/08/2017  . Biotin 5000 MCG CAPS Take 5,000 mcg by mouth daily.   02/08/2017  . IRON PO Take 1 tablet by mouth daily.   02/08/2017  . Multiple Vitamin (MULTIVITAMIN) tablet Take 1 tablet by mouth daily.   02/08/2017  . vitamin C (ASCORBIC ACID) 500 MG tablet Take 500 mg by mouth daily.   02/08/2017    ROS  Blood pressure 115/80, pulse (!) 55, temperature 97.9 F (36.6 C), temperature source Oral, resp. rate 16, last menstrual period  02/03/2017, SpO2 100 %. Physical Exam  Heart- rrr Lungs- CTAB Abd- benign  Results for orders placed or performed during the hospital encounter of 02/15/17 (from the past 24 hour(s))  Pregnancy, urine     Status: None   Collection Time: 02/15/17  6:19 AM  Result Value Ref Range   Preg Test, Ur NEGATIVE NEGATIVE    No results found.  Assessment/Plan: CPP/dysmenorrhea-plan for diagnostic laparoscopy Menorrhagia- plan for d&c, endometrial ablation  She understands the risks of surgery, including, but not to infection, bleeding, DVTs, damage to bowel, bladder, ureters. She wishes to proceed.     MEmily Filbert6/02/2017, 6:53 AM

## 2017-02-15 NOTE — Anesthesia Procedure Notes (Signed)
Procedure Name: Intubation Date/Time: 02/15/2017 7:23 AM Performed by: Tobin Chad Pre-anesthesia Checklist: Patient identified, Emergency Drugs available, Suction available and Patient being monitored Patient Re-evaluated:Patient Re-evaluated prior to inductionOxygen Delivery Method: Circle system utilized and Simple face mask Preoxygenation: Pre-oxygenation with 100% oxygen Intubation Type: IV induction Ventilation: Mask ventilation without difficulty Laryngoscope Size: Mac and 3 Grade View: Grade I Tube type: Oral Tube size: 7.0 mm Number of attempts: 1 Airway Equipment and Method: Stylet Placement Confirmation: ETT inserted through vocal cords under direct vision,  positive ETCO2 and breath sounds checked- equal and bilateral Secured at: 23 cm Tube secured with: Tape Dental Injury: Teeth and Oropharynx as per pre-operative assessment

## 2017-02-15 NOTE — Anesthesia Procedure Notes (Signed)
Anesthesia Regional Block: Narrative:

## 2017-02-16 ENCOUNTER — Encounter (HOSPITAL_COMMUNITY): Payer: Self-pay | Admitting: Obstetrics & Gynecology

## 2017-03-16 ENCOUNTER — Encounter: Payer: Self-pay | Admitting: Obstetrics & Gynecology

## 2017-03-16 ENCOUNTER — Ambulatory Visit (INDEPENDENT_AMBULATORY_CARE_PROVIDER_SITE_OTHER): Payer: No Typology Code available for payment source | Admitting: Obstetrics & Gynecology

## 2017-03-16 VITALS — BP 118/72 | HR 57 | Resp 16 | Ht 62.0 in | Wt 133.0 lb

## 2017-03-16 DIAGNOSIS — Z1151 Encounter for screening for human papillomavirus (HPV): Secondary | ICD-10-CM

## 2017-03-16 DIAGNOSIS — Z124 Encounter for screening for malignant neoplasm of cervix: Secondary | ICD-10-CM

## 2017-03-16 DIAGNOSIS — Z9889 Other specified postprocedural states: Secondary | ICD-10-CM

## 2017-03-16 DIAGNOSIS — Z0000 Encounter for general adult medical examination without abnormal findings: Secondary | ICD-10-CM

## 2017-03-16 NOTE — Progress Notes (Signed)
   Subjective:    Patient ID: Erika Sloan, female    DOB: [birth year], 46 y.o.   MRN: [id number]  HPI 46 yo M AA P3 here for a postop visit. She had a dx lap and d&c on 02-15-17 for CPP and menorrhagia. A couple of fibroids were noted. She had 2 lesions in her cul de sac that were c/w endometriosis but the pathology did not show endometriosis. She has returned to horseback riding and working out. She has had no bleeding since surgery. She has the RLQ pain. She has sex since surgery and says that it was "better". Bowel and bladder function normal (she has had bowel issues since her cholecystectomy in 2010).   Review of Systems Husband has vasectomy.    Objective:   Physical Exam [id number] Breathing, conversing, and ambulating normally 6 pack abs! umbilcal incision not visible The left incision is less than 1 cm, healing great Cervix normal     Assessment & Plan:  Chronic RLQ pain- no endo per pathology Fibroids/menorrhagia (hbg 12.1)- offer OCPs. She doesn't want this due to her mother's h/o breast cancer. I also offered Mirena (less bleeding total), versus hyst.  Versus watchful waiting Preventative care- pap smear, mammo

## 2017-03-21 LAB — CYTOLOGY - PAP
Diagnosis: NEGATIVE
HPV: NOT DETECTED

## 2017-09-18 ENCOUNTER — Ambulatory Visit
Admission: RE | Admit: 2017-09-18 | Discharge: 2017-09-18 | Disposition: A | Discharge status: Home / Self Care | Payer: Commercial Managed Care - HMO | Source: Ambulatory Visit | Attending: Obstetrics & Gynecology | Admitting: Obstetrics & Gynecology

## 2017-09-18 ENCOUNTER — Ambulatory Visit: Payer: Commercial Managed Care - HMO

## 2017-09-18 DIAGNOSIS — Z0000 Encounter for general adult medical examination without abnormal findings: Secondary | ICD-10-CM

## 2018-01-10 ENCOUNTER — Encounter (HOSPITAL_COMMUNITY): Payer: Self-pay | Admitting: *Deleted

## 2018-01-10 ENCOUNTER — Emergency Department (HOSPITAL_COMMUNITY)
Admission: EM | Admit: 2018-01-10 | Discharge: 2018-01-10 | Disposition: A | Discharge status: Home / Self Care | Payer: Commercial Managed Care - PPO | Attending: Emergency Medicine | Admitting: Emergency Medicine

## 2018-01-10 ENCOUNTER — Emergency Department (HOSPITAL_COMMUNITY): Admission: EM | Admit: 2018-01-10 | Discharge: 2018-01-10 | Discharge status: Absent without leave | Payer: Self-pay

## 2018-01-10 DIAGNOSIS — Z79899 Other long term (current) drug therapy: Secondary | ICD-10-CM | POA: Insufficient documentation

## 2018-01-10 DIAGNOSIS — I10 Essential (primary) hypertension: Secondary | ICD-10-CM | POA: Diagnosis not present

## 2018-01-10 LAB — I-STAT TROPONIN, ED: Troponin i, poc: 0 ng/mL (ref 0.00–0.08)

## 2018-01-10 LAB — BASIC METABOLIC PANEL
Anion gap: 8 (ref 5–15)
BUN: 10 mg/dL (ref 6–20)
CO2: 26 mmol/L (ref 22–32)
Calcium: 9.2 mg/dL (ref 8.9–10.3)
Chloride: 107 mmol/L (ref 101–111)
Creatinine, Ser: 0.89 mg/dL (ref 0.44–1.00)
GFR calc Af Amer: >60 mL/min (ref >60)
GFR calc non Af Amer: >60 mL/min (ref >60)
Glucose, Bld: 105 mg/dL — ABNORMAL HIGH (ref 65–99)
Potassium: 3.5 mmol/L (ref 3.5–5.1)
Sodium: 141 mmol/L (ref 135–145)

## 2018-01-10 LAB — CBC
HCT: 35.4 % — ABNORMAL LOW (ref 36.0–46.0)
Hemoglobin: 12.2 g/dL (ref 12.0–15.0)
MCH: 30.5 pg (ref 26.0–34.0)
MCHC: 34.5 g/dL (ref 30.0–36.0)
MCV: 88.5 fL (ref 78.0–100.0)
Platelets: 343 10*3/uL (ref 150–400)
RBC: 4 MIL/uL (ref 3.87–5.11)
RDW: 12.5 % (ref 11.5–15.5)
WBC: 4.7 10*3/uL (ref 4.0–10.5)

## 2018-01-10 MED ORDER — ACETAMINOPHEN 325 MG PO TABS
650.0000 mg | ORAL_TABLET | Freq: Once | ORAL | Status: AC
  Administered 2018-01-10: 650 mg via ORAL
  Filled 2018-01-10: qty 2

## 2018-01-10 NOTE — ED Provider Notes (Signed)
[facility] DEPT Provider Note   CSN: [id number] Arrival date & time: 01/10/18  1352     History   Chief Complaint Chief Complaint  Patient presents with  . Hypertension    HPI Erika Sloan is a 47 y.o. female.  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  Patient reports that her systolic blood pressure normally runs in the 100-110 range and she has never been on an anti-hypertensive.  States that today while at work around 9 AM she felt gradual onset of pressure behind bilateral eyes, as if "my eyes were going to burst."  Also reports intermittent bilateral vision blurriness for a few weeks now, reports thinking that she needs a different glasses prescription. Denies diplopia, visual field loss, photophobia. 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  Reports that she also feels as if her left arm is heavy. Denies loss of sensation.  Denies any associated chest pain, shortness of breath, abdominal pain, nausea/vomiting, weakness, lightheadedness, syncope. She states that her vision is now at baseline. Denies any recent changes to her medications. Denies any recent otc cough and cold medicine or increase in caffeine.    Past Medical History:  Diagnosis Date  . Anemia   . Heart murmur    history of    There are no active problems to display for this patient.   Past Surgical History:  Procedure Laterality Date  . CHOLECYSTECTOMY    . DILITATION & CURRETTAGE/HYSTROSCOPY WITH HYDROTHERMAL ABLATION N/A 02/15/2017   Procedure: DILATATION & CURETTAGE/HYSTEROSCOPY WITH HYDROTHERMAL ABLATION;  Surgeon: Meribeth Vitug Filbert, MD;  Location: [facility];  Service: Gynecology;  Laterality: N/A;  . GALLBLADDER SURGERY  2010  .  HERNIA REPAIR  2005  . LAPAROSCOPY N/A 02/15/2017   Procedure: LAPAROSCOPY OPERATIVE WITH PERITONEAL BIOPSY;  Surgeon: Percival Glasheen Filbert, MD;  Location: [facility];  Service: Gynecology;  Laterality: N/A;  . SIGMOIDOSCOPY    . UPPER GI ENDOSCOPY       OB History    Gravida  4   Para  3   Term  3   Preterm      AB  1   Living  3     SAB  1   TAB      Ectopic      Multiple      Live Births  3            Home Medications    Prior to Admission medications   Medication Sig Start Date End Date Taking? Authorizing Provider  ALPRAZolam Duanne Moron) 0.5 MG tablet Take 0.5 mg by mouth at bedtime. 12/21/17  Yes [provider]  b complex vitamins tablet Take 1 tablet by mouth daily.   Yes [provider]  Biotin 5000 MCG CAPS Take 5,000 mcg by mouth daily.   Yes [provider]  buPROPion (WELLBUTRIN) 100 MG tablet Take 100 mg by mouth 2 (two) times daily. 12/28/17  Yes [provider]  fluticasone (FLONASE) 50 MCG/ACT nasal spray Place 1-2 sprays into both nostrils daily as needed for allergies or rhinitis.   Yes [provider]  IRON PO Take 1 tablet by mouth daily.   Yes [provider]  Multiple Vitamin (MULTIVITAMIN) tablet Take 1 tablet by mouth daily.   Yes [provider]  naproxen (NAPROSYN) 250 MG tablet Take 500 mg by mouth 2 (two) times daily as needed (menstrual pain.).   Yes [provider]  vitamin C (ASCORBIC ACID) 500 MG tablet Take 500 mg by mouth daily.   Yes [provider]  Vitamin D, Ergocalciferol, (DRISDOL) 50000 units CAPS capsule Take 1 capsule by mouth every Saturday. 12/21/17  Yes [provider]  ibuprofen (ADVIL,MOTRIN) 600 MG tablet Take 1 tablet (600 mg total) by mouth every 6 (six) hours as needed. Patient not taking: Reported on 01/10/2018 02/15/17   Erika Vaccaro Filbert, MD    Family History Family History  Problem Relation Age of Onset  . Breast cancer Mother   . Mitral valve  prolapse Mother     Social History Social History   Tobacco Use  . Smoking status: Never Smoker  . Smokeless tobacco: Never Used  Substance Use Topics  . Alcohol use: Yes    Alcohol/week: 3.0 oz    Types: 5 Glasses of wine per week  . Drug use: No     Allergies   Percocet [oxycodone-acetaminophen]   Review of Systems Review of Systems  Constitutional: Negative for chills and fever.  HENT: Negative for congestion and rhinorrhea.   Eyes: Positive for visual disturbance (intermittent for the past several weeks, currently resolved). Negative for photophobia and redness.  Respiratory: Negative for cough and shortness of breath.   Cardiovascular: Negative for chest pain.  Gastrointestinal: Negative for abdominal pain, nausea and vomiting.  Genitourinary: Negative for difficulty urinating.  Musculoskeletal: Positive for myalgias (left arm feels heavy).  Skin: Negative for rash.  Neurological: Positive for headaches (behind bilateral eyes). Negative for dizziness, syncope, weakness, light-headedness and numbness ( ).     Physical Exam Updated Vital Signs BP (!) 144/97   Pulse 66   Temp 97.8 F (36.6 C) (Oral)   Resp 18   SpO2 100%   Physical Exam  Constitutional: She is oriented to person, place, and time. She appears well-developed and well-nourished. No distress.  Sitting at bedside in no apparent distress, nontoxic-appearing.  HENT:  Head: Normocephalic and atraumatic.  Mouth/Throat: Oropharynx is clear and moist. No oropharyngeal exudate.  Eyes: Pupils are equal, round, and reactive to light. Conjunctivae and EOM are normal. Right eye exhibits no discharge. Left eye exhibits no discharge.  Neck: Normal range of motion. Neck supple.  Cardiovascular: Normal rate, regular rhythm and intact distal pulses.  Murmur (systolic grade 2/6 best heard in LUSB) heard. Pulmonary/Chest: Effort normal and breath sounds normal. No stridor. No respiratory distress. She has no  wheezes. She has no rales.  Abdominal: Soft. Bowel sounds are normal. There is no tenderness. There is no guarding.  Neurological: She is alert and oriented to person, place, and time. Coordination normal.  Mental Status:  Alert, oriented, thought content appropriate, able to give a coherent history. Speech fluent without evidence of aphasia. Able to follow 2 step commands without difficulty.  Cranial Nerves:  II:  Peripheral visual fields grossly normal, pupils equal, round, reactive to light III,IV, VI: ptosis not present, extra-ocular motions intact bilaterally  V,VII: smile symmetric, facial light touch sensation equal VIII: hearing grossly normal to voice  X: uvula elevates symmetrically  XI: bilateral shoulder shrug symmetric and strong XII: midline tongue extension without fassiculations Motor:  Normal tone. 5/5 in upper and lower extremities bilaterally including strong and equal grip strength and dorsiflexion/plantar  flexion Sensory: Sensation to light touch intact in bilateral UE. Cerebellar: normal finger-to-nose with bilateral upper extremities Gait: normal gait and balance  Skin: Skin is warm and dry. She is not diaphoretic.  Psychiatric: She has a normal mood and affect. Her behavior is normal.  Nursing note and vitals reviewed.    ED Treatments / Results  Labs (all labs ordered are listed, but only abnormal results are displayed) Labs Reviewed  CBC - Abnormal; Notable for the following components:      Result Value   HCT 35.4 (*)    All other components within normal limits  BASIC METABOLIC PANEL - Abnormal; Notable for the following components:   Glucose, Bld 105 (*)    All other components within normal limits  I-STAT TROPONIN, ED    EKG EKG Interpretation  Date/Time:  Wednesday Jan 10 2018 15:10:02 EDT Ventricular Rate:  66 PR Interval:    QRS Duration: 119 QT Interval:  427 QTC Calculation: 448 R Axis:   81 Text Interpretation:  Atrial flutter with  predominant 3:1 AV block Nonspecific intraventricular conduction delay Minimal ST elevation, inferior leads No acute changes Nonspecific ST and T wave abnormality Reconfirmed by Varney Biles [phone]) on 01/10/2018 4:13:27 PM   Radiology No results found.  Procedures Procedures (including critical care time)  Medications Ordered in ED Medications  acetaminophen (TYLENOL) tablet 650 mg (has no administration in time range)     Initial Impression / Assessment and Plan / ED Course  I have reviewed the triage vital signs and the nursing notes.  Pertinent labs & imaging results that were available during my care of the patient were reviewed by me and considered in my medical decision making (see chart for details).    Blood pressure 161/89 on initial presentation in the ED. Non-toxic appearing. Lungs CTA. No neurological deficits. Lab work reviewed. BMP without any major electrolyte abnormalities. CBC unremarkable.   Patient reports vague heaviness in her LUE, no associated CP, SOB, diaphoresis. No history of ischemic heart disease. Do not suspect ACS given presentation, EKG without acute ischemic change and troponin negative with symptoms present for >6hrs. Do not suspect acute stroke given presentation and no neurological deficits on exam.  In terms of her headache, likely related to hypertension. Will not proceed with head imaging at this time given symptoms. Treated with tylenol in the ED.   Discussed this patient with Dr. Kathrynn Humble who also saw the patient and agrees with plan to discharge patient with instructions to check her blood pressure at home and follow up with her PCP. Counseled her on return precautions and she agrees and appears reliable for follow up.   Final Clinical Impressions(s) / ED Diagnoses   Final diagnoses:  Hypertension, unspecified type    ED Discharge Orders    None       Bernarda Caffey 01/13/18 1936    Varney Biles, MD 01/17/18 1757

## 2018-01-10 NOTE — ED Triage Notes (Signed)
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 Pt had BP of 170/90 and has headache, numbness in her left arm. Pt states she usually has SBP of 100-110.

## 2018-01-10 NOTE — Discharge Instructions (Addendum)
Blood work was reassuring today.    Please check your blood pressure at home, write this down and follow-up with your regular doctor for further management  I have listed the information to the neurology clinic which you can follow-up with for your headaches.  Return to the emergency department if you have any new or concerning symptoms like worsening headache, chest pain, shortness of breath.

## 2018-07-11 ENCOUNTER — Other Ambulatory Visit: Payer: Self-pay | Admitting: Obstetrics and Gynecology

## 2018-07-11 NOTE — Patient Instructions (Addendum)
Your procedure is scheduled on  ____11-6-2019_____    Report to [place] AM    Call this number if you have problems the morning of surgery  :[phone].     OUR ADDRESS IS [place].  WE ARE LOCATED IN [facility].                                        REMEMBER:  DO NOT EAT FOOD OR DRINK LIQUIDS AFTER MIDNIGHT .    TAKE THESE MEDICATIONS MORNING OF SURGERY WITH A SIP OF WATER:  ____NONE   IF YOU ARE SPENDING THE NIGHT AFTER SURGERY PLEASE BRING ALL YOUR PRESCRIPTION MEDICATIONS IN THEIR ORIGINAL BOTTLES.                                     DO NOT WEAR JEWERLY, MAKE UP, OR NAIL POLISH,   DO NOT WEAR LOTIONS, POWDERS, PERFUMES OR DEODORANT.  DO NOT SHAVE FOR 24 HOURS PRIOR TO DAY OF SURGERY.  CONTACTS, GLASSES, OR DENTURES MAY NOT BE WORN TO SURGERY.                                    [facility] IS NOT RESPONSIBLE  FOR ANY BELONGINGS.             Special Instructions:  PT GIVEN HANDOUT FOR GUIDELINES FOR RECOVERY CARE                                                                .                                       [facility] - Preparing for Surgery Before surgery, you can play an important role.  Because skin is not sterile, your skin needs to be as free of germs as possible.  You can reduce the number of germs on your skin by washing with CHG (chlorahexidine gluconate) soap before surgery.  CHG is an antiseptic cleaner which kills germs and bonds with the skin to continue killing germs even after washing. Please DO NOT use if you have an allergy to CHG or antibacterial soaps.  If your skin becomes reddened/irritated stop using the CHG and inform your nurse when you arrive at Short Stay. Do not shave (including legs and underarms) for at least 48 hours prior to the first CHG shower.  You may shave your face/neck. Please follow these instructions carefully:  1.  Shower with CHG Soap the night before  surgery and the  morning of Surgery.  2.  If you choose to wash your hair, wash your hair first as usual with your  normal  shampoo.  3.  After you shampoo, rinse your hair and body thoroughly to remove the  shampoo.  4.  Use CHG as you would any other liquid soap.  You can apply chg directly  to the skin and wash                       Gently with a scrungie or clean washcloth.  5.  Apply the CHG Soap to your body ONLY FROM THE NECK DOWN.   Do not use on face/ open                           Wound or open sores. Avoid contact with eyes, ears mouth and genitals (private parts).                       Wash face,  Genitals (private parts) with your normal soap.             6.  Wash thoroughly, paying special attention to the area where your surgery  will be performed.  7.  Thoroughly rinse your body with warm water from the neck down.  8.  DO NOT shower/wash with your normal soap after using and rinsing off  the CHG Soap.             9.  Pat yourself dry with a clean towel.            10.  Wear clean pajamas.            11.  Place clean sheets on your bed the night of your first shower and do not  sleep with pets. Day of Surgery : Do not apply any lotions/deodorants the morning of surgery.  Please wear clean clothes to the surgery center.   PATIENT SIGNATURE_________________________________  NURSE SIGNATURE__________________________________  ________________________________________________________________________

## 2018-07-12 ENCOUNTER — Encounter (HOSPITAL_COMMUNITY)
Admission: RE | Admit: 2018-07-12 | Discharge: 2018-07-12 | Disposition: A | Discharge status: Home / Self Care | Payer: Commercial Managed Care - PPO | Source: Ambulatory Visit | Attending: Obstetrics and Gynecology | Admitting: Obstetrics and Gynecology

## 2018-07-12 ENCOUNTER — Encounter (HOSPITAL_COMMUNITY): Payer: Self-pay | Admitting: *Deleted

## 2018-07-12 ENCOUNTER — Other Ambulatory Visit: Payer: Self-pay

## 2018-07-12 DIAGNOSIS — Z01818 Encounter for other preprocedural examination: Secondary | ICD-10-CM | POA: Insufficient documentation

## 2018-07-12 DIAGNOSIS — D259 Leiomyoma of uterus, unspecified: Secondary | ICD-10-CM | POA: Insufficient documentation

## 2018-07-12 HISTORY — DX: Presence of spectacles and contact lenses: Z97.3

## 2018-07-12 LAB — BASIC METABOLIC PANEL
Anion gap: 5 (ref 5–15)
BUN: 16 mg/dL (ref 6–20)
CO2: 26 mmol/L (ref 22–32)
Calcium: 9.2 mg/dL (ref 8.9–10.3)
Chloride: 106 mmol/L (ref 98–111)
Creatinine, Ser: 0.82 mg/dL (ref 0.44–1.00)
GFR calc Af Amer: >60 mL/min (ref >60)
GFR calc non Af Amer: >60 mL/min (ref >60)
Glucose, Bld: 79 mg/dL (ref 70–99)
Potassium: 3.5 mmol/L (ref 3.5–5.1)
Sodium: 137 mmol/L (ref 135–145)

## 2018-07-12 LAB — CBC
HCT: 36.6 % (ref 36.0–46.0)
Hemoglobin: 11.6 g/dL — ABNORMAL LOW (ref 12.0–15.0)
MCH: 29.6 pg (ref 26.0–34.0)
MCHC: 31.7 g/dL (ref 30.0–36.0)
MCV: 93.4 fL (ref 80.0–100.0)
Platelets: 322 10*3/uL (ref 150–400)
RBC: 3.92 MIL/uL (ref 3.87–5.11)
RDW: 12.1 % (ref 11.5–15.5)
WBC: 4.7 10*3/uL (ref 4.0–10.5)
nRBC: 0 % (ref 0.0–0.2)

## 2018-07-13 LAB — ABO/RH: ABO/RH(D): O POS

## 2018-07-13 NOTE — Progress Notes (Signed)
Final EKG dated 07-12-2018 in epic.   Pt arriving at [place] to [facility].  Needs urine preg.

## 2018-07-17 NOTE — H&P (Signed)
Admission History and Physical Exam for a Gynecology Patient  [name] is a 47 y.o. female, [id number], who presents for for a vaginal hysterectomy and bilateral salpingectomy.  She complains of pelvic pain and fibroids.  In 2018, she had a laparoscopy.  Biopsies were taken but no endometriosis was seen.  She also had hysteroscopy, D&C, and ablation of the endometrium.  Her pathology report was benign.  She has been followed at the [facility] and Gynecology division of [facility].  OB History    Gravida  4   Para  3   Term  3   Preterm      AB  1   Living  3     SAB  1   TAB      Ectopic      Multiple      Live Births  3           Past Medical History:  Diagnosis Date  . Anemia   . Heart murmur    07-12-2018  per pt has told at times murmur heard, but is asymptomatic  . Wears glasses     Medications:  Nonsteroidal anti-inflammatory agents  Past Surgical History:  Procedure Laterality Date  . DILITATION & CURRETTAGE/HYSTROSCOPY WITH HYDROTHERMAL ABLATION N/A 02/15/2017   Procedure: DILATATION & CURETTAGE/HYSTEROSCOPY WITH HYDROTHERMAL ABLATION;  Surgeon: Emily Filbert, MD;  Location: [facility];  Service: Gynecology;  Laterality: N/A;  . HERNIA REPAIR  2005  . LAPAROSCOPIC CHOLECYSTECTOMY  2008 approx.  Marland Kitchen LAPAROSCOPY N/A 02/15/2017   Procedure: LAPAROSCOPY OPERATIVE WITH PERITONEAL BIOPSY;  Surgeon: Emily Filbert, MD;  Location: [facility];  Service: Gynecology;  Laterality: N/A;  . SIGMOIDOSCOPY    . UMBILICAL HERNIA REPAIR  2005  . UPPER GI ENDOSCOPY      Allergies  Allergen Reactions  . Oxycodone Itching    Family History: family history includes Breast cancer in her mother; Mitral valve prolapse in her mother.  Social History:  reports that she has never smoked. She has never used smokeless tobacco. She reports that she drinks alcohol. She reports that she does not use drugs.  Review of systems: See HPI.  Admission  Physical Exam:    BMI equals 23.9  There were no vitals taken for this visit.  HEENT:                 Within normal limits Chest:                   Clear Heart:                    Regular rate and rhythm Breasts:                No masses, skin changes, bleeding, or discharge present Abdomen:             Nontender, no masses Extremities:          Grossly normal Neurologic exam: Grossly normal  Pelvic exam:  External genitalia: normal general appearance Vaginal: normal without tenderness, induration or masses Cervix: normal appearance Adnexa: normal bimanual exam Uterus: 8 weeks size, irregular, firm  Assessment:  Dysmenorrhea  Fibroid uterus  Plan:  The patient will undergo a vaginal hysterectomy with bilateral salpingectomy.  She understands the indications for her surgical procedure.  She accepts the risk of, but not limited to, anesthetic complications, bleeding, infections, and possible damage to the surrounding organs.   Arnell Sieving  Burnard Leigh 07/17/2018

## 2018-07-18 ENCOUNTER — Ambulatory Visit (HOSPITAL_BASED_OUTPATIENT_CLINIC_OR_DEPARTMENT_OTHER): Payer: Commercial Managed Care - PPO | Admitting: Anesthesiology

## 2018-07-18 ENCOUNTER — Other Ambulatory Visit: Payer: Self-pay

## 2018-07-18 ENCOUNTER — Encounter (HOSPITAL_BASED_OUTPATIENT_CLINIC_OR_DEPARTMENT_OTHER): Payer: Self-pay | Admitting: *Deleted

## 2018-07-18 ENCOUNTER — Encounter (HOSPITAL_COMMUNITY)
Admission: RE | Disposition: A | Discharge status: Home / Self Care | Payer: Self-pay | Source: Other Acute Inpatient Hospital | Attending: Obstetrics and Gynecology

## 2018-07-18 ENCOUNTER — Ambulatory Visit (HOSPITAL_BASED_OUTPATIENT_CLINIC_OR_DEPARTMENT_OTHER)
Admission: RE | Admit: 2018-07-18 | Discharge: 2018-07-19 | Disposition: A | Discharge status: Home / Self Care | Payer: Commercial Managed Care - PPO | Source: Other Acute Inpatient Hospital | Attending: Obstetrics and Gynecology | Admitting: Obstetrics and Gynecology

## 2018-07-18 DIAGNOSIS — N888 Other specified noninflammatory disorders of cervix uteri: Secondary | ICD-10-CM | POA: Diagnosis not present

## 2018-07-18 DIAGNOSIS — D259 Leiomyoma of uterus, unspecified: Secondary | ICD-10-CM | POA: Diagnosis present

## 2018-07-18 DIAGNOSIS — R102 Pelvic and perineal pain: Secondary | ICD-10-CM | POA: Insufficient documentation

## 2018-07-18 DIAGNOSIS — N946 Dysmenorrhea, unspecified: Secondary | ICD-10-CM | POA: Diagnosis not present

## 2018-07-18 HISTORY — PX: UNILATERAL SALPINGECTOMY: SHX6160

## 2018-07-18 HISTORY — PX: VAGINAL HYSTERECTOMY: SHX2639

## 2018-07-18 LAB — TYPE AND SCREEN
ABO/RH(D): O POS
Antibody Screen: NEGATIVE

## 2018-07-18 LAB — POCT PREGNANCY, URINE: Preg Test, Ur: NEGATIVE

## 2018-07-18 SURGERY — HYSTERECTOMY, VAGINAL
Anesthesia: General | Site: Vagina | Laterality: Bilateral

## 2018-07-18 MED ORDER — PROPOFOL 10 MG/ML IV BOLUS
INTRAVENOUS | Status: AC
  Filled 2018-07-18: qty 20

## 2018-07-18 MED ORDER — ACETAMINOPHEN 500 MG PO TABS
ORAL_TABLET | ORAL | Status: AC
  Filled 2018-07-18: qty 2

## 2018-07-18 MED ORDER — FAMOTIDINE 20 MG PO TABS
20.0000 mg | ORAL_TABLET | Freq: Once | ORAL | Status: AC
  Administered 2018-07-18: 20 mg via ORAL
  Filled 2018-07-18: qty 1

## 2018-07-18 MED ORDER — MIDAZOLAM HCL 2 MG/2ML IJ SOLN
INTRAMUSCULAR | Status: AC
  Filled 2018-07-18: qty 2

## 2018-07-18 MED ORDER — LIDOCAINE HCL (CARDIAC) PF 100 MG/5ML IV SOSY
PREFILLED_SYRINGE | INTRAVENOUS | Status: DC | PRN
  Administered 2018-07-18: 80 mg via INTRAVENOUS

## 2018-07-18 MED ORDER — MIDAZOLAM HCL 5 MG/5ML IJ SOLN
INTRAMUSCULAR | Status: DC | PRN
  Administered 2018-07-18: 2 mg via INTRAVENOUS

## 2018-07-18 MED ORDER — FENTANYL CITRATE (PF) 100 MCG/2ML IJ SOLN
INTRAMUSCULAR | Status: AC
  Filled 2018-07-18: qty 2

## 2018-07-18 MED ORDER — FENTANYL CITRATE (PF) 100 MCG/2ML IJ SOLN
INTRAMUSCULAR | Status: DC | PRN
  Administered 2018-07-18 (×2): 50 ug via INTRAVENOUS
  Administered 2018-07-18: 25 ug via INTRAVENOUS
  Administered 2018-07-18: 50 ug via INTRAVENOUS
  Administered 2018-07-18: 25 ug via INTRAVENOUS

## 2018-07-18 MED ORDER — ROCURONIUM BROMIDE 10 MG/ML (PF) SYRINGE
PREFILLED_SYRINGE | INTRAVENOUS | Status: AC
  Filled 2018-07-18: qty 10

## 2018-07-18 MED ORDER — SODIUM CHLORIDE (PF) 0.9 % IJ SOLN
INTRAMUSCULAR | Status: DC | PRN
  Administered 2018-07-18: 20 mL

## 2018-07-18 MED ORDER — HYDROMORPHONE HCL 2 MG PO TABS
2.0000 mg | ORAL_TABLET | Freq: Four times a day (QID) | ORAL | Status: DC | PRN
  Administered 2018-07-18 – 2018-07-19 (×2): 2 mg via ORAL
  Filled 2018-07-18 (×2): qty 1

## 2018-07-18 MED ORDER — PROMETHAZINE HCL 25 MG/ML IJ SOLN
6.2500 mg | INTRAMUSCULAR | Status: DC | PRN
  Filled 2018-07-18: qty 1

## 2018-07-18 MED ORDER — KETOROLAC TROMETHAMINE 30 MG/ML IJ SOLN
INTRAMUSCULAR | Status: AC
  Filled 2018-07-18: qty 1

## 2018-07-18 MED ORDER — SUGAMMADEX SODIUM 200 MG/2ML IV SOLN
INTRAVENOUS | Status: AC
  Filled 2018-07-18: qty 2

## 2018-07-18 MED ORDER — IBUPROFEN 400 MG PO TABS: 600.0000 mg | ORAL_TABLET | Freq: Four times a day (QID) | ORAL | Status: DC | PRN

## 2018-07-18 MED ORDER — KETOROLAC TROMETHAMINE 30 MG/ML IJ SOLN
INTRAMUSCULAR | Status: DC | PRN
  Administered 2018-07-18: 30 mg via INTRAVENOUS

## 2018-07-18 MED ORDER — ONDANSETRON HCL 4 MG PO TABS: 4.0000 mg | ORAL_TABLET | Freq: Three times a day (TID) | ORAL | Status: DC | PRN

## 2018-07-18 MED ORDER — BUPIVACAINE LIPOSOME 1.3 % IJ SUSP
INTRAMUSCULAR | Status: DC | PRN
  Administered 2018-07-18: 20 mL

## 2018-07-18 MED ORDER — SCOPOLAMINE 1 MG/3DAYS TD PT72
MEDICATED_PATCH | TRANSDERMAL | Status: AC
  Filled 2018-07-18: qty 1

## 2018-07-18 MED ORDER — CELECOXIB 200 MG PO CAPS
ORAL_CAPSULE | ORAL | Status: AC
  Filled 2018-07-18: qty 1

## 2018-07-18 MED ORDER — HYDROMORPHONE HCL 1 MG/ML IJ SOLN
0.2500 mg | INTRAMUSCULAR | Status: DC | PRN
  Administered 2018-07-18 (×2): 0.5 mg via INTRAVENOUS
  Filled 2018-07-18: qty 0.5

## 2018-07-18 MED ORDER — LACTATED RINGERS IV SOLN
INTRAVENOUS | Status: DC
  Administered 2018-07-18 (×2): via INTRAVENOUS
  Filled 2018-07-18: qty 1000

## 2018-07-18 MED ORDER — ROCURONIUM BROMIDE 100 MG/10ML IV SOLN
INTRAVENOUS | Status: DC | PRN
  Administered 2018-07-18: 40 mg via INTRAVENOUS
  Administered 2018-07-18: 10 mg via INTRAVENOUS

## 2018-07-18 MED ORDER — MENTHOL 3 MG MT LOZG: 1.0000 | LOZENGE | OROMUCOSAL | Status: DC | PRN

## 2018-07-18 MED ORDER — EPINEPHRINE PF 1 MG/ML IJ SOLN
INTRAMUSCULAR | Status: DC | PRN
  Administered 2018-07-18: 30 mL

## 2018-07-18 MED ORDER — ACETAMINOPHEN 160 MG/5ML PO SOLN
960.0000 mg | Freq: Once | ORAL | Status: AC
  Filled 2018-07-18: qty 40.6

## 2018-07-18 MED ORDER — HYDROMORPHONE HCL 1 MG/ML IJ SOLN: 1.0000 mg | INTRAMUSCULAR | Status: DC | PRN

## 2018-07-18 MED ORDER — LACTATED RINGERS IV SOLN: INTRAVENOUS | Status: DC

## 2018-07-18 MED ORDER — SUGAMMADEX SODIUM 200 MG/2ML IV SOLN
INTRAVENOUS | Status: DC | PRN
  Administered 2018-07-18: 120 mg via INTRAVENOUS

## 2018-07-18 MED ORDER — HYDROMORPHONE HCL 1 MG/ML IJ SOLN
INTRAMUSCULAR | Status: AC
  Filled 2018-07-18: qty 1

## 2018-07-18 MED ORDER — ONDANSETRON HCL 4 MG/2ML IJ SOLN
INTRAMUSCULAR | Status: DC | PRN
  Administered 2018-07-18: 4 mg via INTRAVENOUS

## 2018-07-18 MED ORDER — KETOROLAC TROMETHAMINE 30 MG/ML IJ SOLN
30.0000 mg | Freq: Four times a day (QID) | INTRAMUSCULAR | Status: AC
  Administered 2018-07-18 – 2018-07-19 (×3): 30 mg via INTRAVENOUS
  Filled 2018-07-18 (×3): qty 1

## 2018-07-18 MED ORDER — CEFAZOLIN SODIUM-DEXTROSE 2-4 GM/100ML-% IV SOLN
INTRAVENOUS | Status: AC
  Filled 2018-07-18: qty 100

## 2018-07-18 MED ORDER — ONDANSETRON HCL 4 MG/2ML IJ SOLN
INTRAMUSCULAR | Status: AC
  Filled 2018-07-18: qty 2

## 2018-07-18 MED ORDER — DEXAMETHASONE SODIUM PHOSPHATE 10 MG/ML IJ SOLN
INTRAMUSCULAR | Status: AC
  Filled 2018-07-18: qty 1

## 2018-07-18 MED ORDER — SCOPOLAMINE 1 MG/3DAYS TD PT72
1.0000 | MEDICATED_PATCH | Freq: Once | TRANSDERMAL | Status: DC
  Administered 2018-07-18: 1.5 mg via TRANSDERMAL
  Filled 2018-07-18: qty 1

## 2018-07-18 MED ORDER — CEFAZOLIN SODIUM-DEXTROSE 2-4 GM/100ML-% IV SOLN
2.0000 g | INTRAVENOUS | Status: AC
  Administered 2018-07-18: 2 g via INTRAVENOUS
  Filled 2018-07-18: qty 100

## 2018-07-18 MED ORDER — BUPIVACAINE HCL (PF) 0.5 % IJ SOLN
INTRAMUSCULAR | Status: DC | PRN
  Administered 2018-07-18: 10 mL

## 2018-07-18 MED ORDER — PROPOFOL 10 MG/ML IV BOLUS
INTRAVENOUS | Status: DC | PRN
  Administered 2018-07-18: 150 mg via INTRAVENOUS

## 2018-07-18 MED ORDER — DOCUSATE SODIUM 100 MG PO CAPS
100.0000 mg | ORAL_CAPSULE | Freq: Two times a day (BID) | ORAL | Status: DC
  Administered 2018-07-18: 100 mg via ORAL
  Filled 2018-07-18: qty 1

## 2018-07-18 MED ORDER — DEXAMETHASONE SODIUM PHOSPHATE 4 MG/ML IJ SOLN
INTRAMUSCULAR | Status: DC | PRN
  Administered 2018-07-18: 10 mg via INTRAVENOUS

## 2018-07-18 MED ORDER — CELECOXIB 200 MG PO CAPS
200.0000 mg | ORAL_CAPSULE | Freq: Once | ORAL | Status: AC | PRN
  Administered 2018-07-18: 200 mg via ORAL
  Filled 2018-07-18: qty 1

## 2018-07-18 MED ORDER — ACETAMINOPHEN 500 MG PO TABS
1000.0000 mg | ORAL_TABLET | Freq: Once | ORAL | Status: AC
  Administered 2018-07-18: 1000 mg via ORAL
  Filled 2018-07-18: qty 2

## 2018-07-18 MED ORDER — FAMOTIDINE 20 MG PO TABS
ORAL_TABLET | ORAL | Status: AC
  Filled 2018-07-18: qty 1

## 2018-07-18 SURGICAL SUPPLY — 31 items
CANISTER SUCT 3000ML PPV (MISCELLANEOUS) ×2 IMPLANT
DECANTER SPIKE VIAL GLASS SM (MISCELLANEOUS) IMPLANT
DRAPE SHEET LG 3/4 BI-LAMINATE (DRAPES) ×4 IMPLANT
GAUZE 4X4 16PLY RFD (DISPOSABLE) ×2 IMPLANT
GLOVE BIOGEL PI IND STRL 6.5 (GLOVE) ×1 IMPLANT
GLOVE BIOGEL PI IND STRL 7.0 (GLOVE) ×1 IMPLANT
GLOVE BIOGEL PI IND STRL 7.5 (GLOVE) IMPLANT
GLOVE BIOGEL PI IND STRL 8.5 (GLOVE) ×1 IMPLANT
GLOVE BIOGEL PI INDICATOR 6.5 (GLOVE) ×1
GLOVE BIOGEL PI INDICATOR 7.0 (GLOVE) ×1
GLOVE BIOGEL PI INDICATOR 7.5 (GLOVE) ×1
GLOVE BIOGEL PI INDICATOR 8.5 (GLOVE) ×1
GLOVE ECLIPSE 8.0 STRL XLNG CF (GLOVE) ×4 IMPLANT
GOWN STRL REUS W/TWL LRG LVL3 (GOWN DISPOSABLE) ×8 IMPLANT
NDL MAYO CATGUT SZ4 TPR NDL (NEEDLE) ×1 IMPLANT
NDL SPNL 22GX3.5 QUINCKE BK (NEEDLE) IMPLANT
NEEDLE MAYO CATGUT SZ4 (NEEDLE) ×2 IMPLANT
NEEDLE SPNL 22GX3.5 QUINCKE BK (NEEDLE) IMPLANT
NS IRRIG 1000ML POUR BTL (IV SOLUTION) ×2 IMPLANT
PACK VAGINAL WOMENS (CUSTOM PROCEDURE TRAY) ×2 IMPLANT
PAD OB MATERNITY 4.3X12.25 (PERSONAL CARE ITEMS) ×2 IMPLANT
PENCIL SMOKE EVAC W/HOLSTER (ELECTROSURGICAL) ×2 IMPLANT
SUT CHROMIC 2 0 TIES 18 (SUTURE) IMPLANT
SUT VIC AB 0 CT1 18XCR BRD8 (SUTURE) ×3 IMPLANT
SUT VIC AB 0 CT1 27 (SUTURE) ×2
SUT VIC AB 0 CT1 27XBRD ANBCTR (SUTURE) ×1 IMPLANT
SUT VIC AB 0 CT1 8-18 (SUTURE) ×6
SUT VICRYL 0 TIES 12 18 (SUTURE) ×2 IMPLANT
SYR CONTROL 10ML LL (SYRINGE) ×1 IMPLANT
TOWEL OR 17X24 6PK STRL BLUE (TOWEL DISPOSABLE) ×4 IMPLANT
TRAY FOLEY W/BAG SLVR 14FR (SET/KITS/TRAYS/PACK) ×2 IMPLANT

## 2018-07-18 NOTE — Anesthesia Postprocedure Evaluation (Signed)
Anesthesia Post Note  Patient: Erika Sloan  Procedure(s) Performed: HYSTERECTOMY VAGINAL (Bilateral Vagina ) BILATERAL SALPINGECTOMY (Bilateral Vagina )     Patient location during evaluation: PACU Anesthesia Type: General Level of consciousness: awake and alert Pain management: pain level controlled Vital Signs Assessment: post-procedure vital signs reviewed and stable Respiratory status: spontaneous breathing, nonlabored ventilation and respiratory function stable Cardiovascular status: blood pressure returned to baseline and stable Postop Assessment: no apparent nausea or vomiting Anesthetic complications: no    Last Vitals:  Vitals:   07/18/18 1345 07/18/18 1400  BP: 104/61 (!) 114/58  Pulse: (!) 57 64  Resp: (!) 21 (!) 24  Temp:    SpO2: 100% 100%    Last Pain:  Vitals:   07/18/18 0913  TempSrc: Oral                 Brennan Bailey

## 2018-07-18 NOTE — Op Note (Signed)
OPERATIVE NOTE  Erika Sloan  DOB:    Jun 09, 1971  MRN:    [id number]  CSN:    [id number]  Date of Surgery:  07/18/2018  Preoperative Diagnosis:  Fibroid uterus  Pelvic pressure symptoms  Postoperative Diagnosis:  Same  Procedure:  Vaginal hysterectomy Uterine morcellation Bilateral salpingectomy  Surgeon:  Gildardo Cranker, M.D.  Assistant:  Earnstine Regal PA-C  Anesthetic:  General  Disposition:  The patient presents with the above-mentioned diagnosis. She understands the indications for surgical procedure.  She also understands the alternative treatment options. She accepts the risk of, but not limited to, anesthetic complications, bleeding, infections, and possible damage to the surrounding organs.  Findings:  The patient was noted to have a 12-week size 340 g uterus.  Multiple fibroids were present.  The fallopian tubes and ovaries appeared normal.  Procedure:  The patient was taken to the operating room where a general anesthetic was given. The patient's lower abdomen, perineum, and vagina were prepped with multiple layers of Betadine. A Foley catheter was placed in the bladder. The patient was sterilely draped. An examination under anesthesia was performed. Operative findings are mentioned above. The cervix was injected with half percent Marcaine and then diluted epinephrine. A circumferential incision was made around the cervix. The vaginal mucosa was advanced anteriorly and posteriorly. The anterior cul-de-sac and then the posterior cul-de-sac were sharply entered. Alternating from right to left the uterosacral ligaments, paracervical tissues, parametrial tissues, and uterine arteries were clamped, cut, sutured, and tied securely.  Because of the large fibroids, uterine morcellation was required to invert the uterus through the posterior colpotomy.  The upper pedicles were then clamped and cut. The uterus was removed from the operative field. The upper  pedicles were secured using free ties and then suture ligatures. The left fallopian tube was identified. The mesosalpinx was clamped and cut removing the entire tube. Interrupted sutures were used for hemostasis. An identical procedure was carried out on the opposite side. Hemostasis was confirmed. The sutures attached to the uterosacral ligaments were brought out through the vaginal angles and then tied securely. A McCall culdoplasty suture was placed in the posterior cul-de-sac incorporating the uterosacral ligaments bilaterally and the posterior peritoneum. A final check was made for hemostasis and again hemostasis was confirmed. The vaginal cuff was closed using figure-of-eight sutures incorporating the anterior vaginal mucosa, the anterior peritoneum, posterior peritoneum, and the posterior vaginal mucosa. The McCall culdoplasty suture was tied securely and the apex of the vagina was noted to elevate into the midpelvis. The patient tolerated her procedure well. She was awakened from her anesthetic without difficulty and then transported to the recovery room in stable condition. Sponge, needle, and instrument counts were correct on 2 occasions. The estimated blood loss was 200 cc's. 0 Vicryl is the suture material used throughout the procedure. The uterus and tubes were sent to pathology.  Gildardo Cranker, M.D.  07/18/2018

## 2018-07-18 NOTE — Transfer of Care (Signed)
Immediate Anesthesia Transfer of Care Note  Patient: Erika Sloan  Procedure(s) Performed: HYSTERECTOMY VAGINAL (Bilateral Vagina ) BILATERAL SALPINGECTOMY (Bilateral Vagina )  Patient Location: PACU  Anesthesia Type:General  Level of Consciousness: drowsy  Airway & Oxygen Therapy: Patient Spontanous Breathing and Patient connected to nasal cannula oxygen  Post-op Assessment: Report given to RN  Post vital signs: Reviewed and stable  Last Vitals: 111/60, 70, 16, 100%, 98.5 Vitals Value Taken Time  BP    Temp    Pulse    Resp    SpO2      Last Pain:  Vitals:   07/18/18 0913  TempSrc: Oral         Complications: No apparent anesthesia complications

## 2018-07-18 NOTE — Progress Notes (Signed)
Subjective: Patient reports tolerating PO.    Objective: I have reviewed patient's vital signs and intake and output.  General: alert and no distress GI: normal findings: soft, non-tender Extremities: extremities normal, atraumatic, no cyanosis or edema Vaginal Bleeding: minimal   Assessment/Plan:  Doing well.   LOS: 0 days    Eli Hose 07/18/2018, 5:46 PM

## 2018-07-18 NOTE — Progress Notes (Signed)
The patient was interviewed and examined today.  The previously documented history and physical examination was reviewed. There are no changes. The operative procedure was reviewed. The risks and benefits were outlined again. The specific risks include, but are not limited to, anesthetic complications, bleeding, infections, and possible damage to the surrounding organs. The patient's questions were answered.  We are ready to proceed as outlined. The likelihood of the patient achieving the goals of this procedure is very likely.   BP 121/81   Pulse 66   Temp 98.7 F (37.1 C) (Oral)   Resp 14   Ht 5' 2"  (1.575 m)   Wt 59 kg   SpO2 100%   BMI 23.80 kg/m  CBC    Component Value Date/Time   WBC 4.7 07/12/2018 1424   RBC 3.92 07/12/2018 1424   HGB 11.6 (L) 07/12/2018 1424   HGB 11.4 01/13/2017 1049   HCT 36.6 07/12/2018 1424   HCT 34.2 01/13/2017 1049   PLT 322 07/12/2018 1424   PLT 326 01/13/2017 1049   MCV 93.4 07/12/2018 1424   MCV 91 01/13/2017 1049   MCH 29.6 07/12/2018 1424   MCHC 31.7 07/12/2018 1424   RDW 12.1 07/12/2018 1424   RDW 12.8 01/13/2017 1049    Gildardo Cranker, M.D.

## 2018-07-18 NOTE — Anesthesia Preprocedure Evaluation (Addendum)
Anesthesia Evaluation  Patient identified by MRN, date of birth, ID band Patient awake    Reviewed: Allergy & Precautions, NPO status , Patient's Chart, lab work & pertinent test results  Airway Mallampati: II  TM Distance: >3 FB Neck ROM: Full    Dental no notable dental hx.    Pulmonary neg pulmonary ROS,    Pulmonary exam normal breath sounds clear to auscultation       Cardiovascular negative cardio ROS Normal cardiovascular exam Rhythm:Regular Rate:Normal  ECG: rate 58. Sinus bradycardia Possible Left atrial enlargement   Neuro/Psych negative neurological ROS  negative psych ROS   GI/Hepatic negative GI ROS, Neg liver ROS,   Endo/Other  negative endocrine ROS  Renal/GU negative Renal ROS     Musculoskeletal negative musculoskeletal ROS (+)   Abdominal   Peds  Hematology  (+) anemia ,   Anesthesia Other Findings Fibroids Pelvic Pain  Reproductive/Obstetrics                            Anesthesia Physical Anesthesia Plan  ASA: II  Anesthesia Plan: General   Post-op Pain Management:    Induction: Intravenous  PONV Risk Score and Plan: 4 or greater and Scopolamine patch - Pre-op, Midazolam, Dexamethasone, Ondansetron and Treatment may vary due to age or medical condition  Airway Management Planned: Oral ETT  Additional Equipment:   Intra-op Plan:   Post-operative Plan: Extubation in OR  Informed Consent: I have reviewed the patients History and Physical, chart, labs and discussed the procedure including the risks, benefits and alternatives for the proposed anesthesia with the patient or authorized representative who has indicated his/her understanding and acceptance.   Dental advisory given  Plan Discussed with: CRNA  Anesthesia Plan Comments:         Anesthesia Quick Evaluation

## 2018-07-18 NOTE — Anesthesia Procedure Notes (Signed)
Procedure Name: Intubation Date/Time: 07/18/2018 11:32 AM Performed by: Justice Rocher, CRNA Pre-anesthesia Checklist: Patient identified, Emergency Drugs available, Suction available and Patient being monitored Patient Re-evaluated:Patient Re-evaluated prior to induction Oxygen Delivery Method: Circle system utilized Preoxygenation: Pre-oxygenation with 100% oxygen Induction Type: IV induction Ventilation: Mask ventilation without difficulty Laryngoscope Size: Mac and 3 Grade View: Grade I Tube type: Oral Tube size: 7.0 mm Number of attempts: 1 Airway Equipment and Method: Stylet and Oral airway Placement Confirmation: ETT inserted through vocal cords under direct vision,  positive ETCO2 and breath sounds checked- equal and bilateral Secured at: 22 cm Tube secured with: Tape Dental Injury: Teeth and Oropharynx as per pre-operative assessment

## 2018-07-19 ENCOUNTER — Encounter (HOSPITAL_BASED_OUTPATIENT_CLINIC_OR_DEPARTMENT_OTHER): Payer: Self-pay | Admitting: Obstetrics and Gynecology

## 2018-07-19 DIAGNOSIS — D259 Leiomyoma of uterus, unspecified: Secondary | ICD-10-CM | POA: Diagnosis not present

## 2018-07-19 LAB — CBC
HCT: 31.3 % — ABNORMAL LOW (ref 36.0–46.0)
Hemoglobin: 10.2 g/dL — ABNORMAL LOW (ref 12.0–15.0)
MCH: 29.8 pg (ref 26.0–34.0)
MCHC: 32.6 g/dL (ref 30.0–36.0)
MCV: 91.5 fL (ref 80.0–100.0)
Platelets: 257 10*3/uL (ref 150–400)
RBC: 3.42 MIL/uL — ABNORMAL LOW (ref 3.87–5.11)
RDW: 11.9 % (ref 11.5–15.5)
WBC: 7.3 10*3/uL (ref 4.0–10.5)
nRBC: 0 % (ref 0.0–0.2)

## 2018-07-19 MED ORDER — HYDROMORPHONE HCL 2 MG PO TABS: 2.0000 mg | ORAL_TABLET | ORAL | 0 refills | Status: DC | PRN

## 2018-07-19 MED ORDER — IBUPROFEN 800 MG PO TABS: 800.0000 mg | ORAL_TABLET | Freq: Three times a day (TID) | ORAL | 1 refills | Status: DC | PRN

## 2018-07-19 NOTE — Discharge Summary (Signed)
Physician Discharge Summary  Patient ID: Erika Sloan MRN: [id number] DOB/AGE: 04-13-1971 47 y.o.  Admit date:         07/18/2018 Discharge date: 07/19/2018  Admission Diagnoses:  Fibroids, Pelvic Pain  Discharge Diagnoses:   Fibroids, Pelvic Pain  Procedures this Admission:  07/18/2018  Procedure(s) (LRB): HYSTERECTOMY VAGINAL (Bilateral) BILATERAL SALPINGECTOMY (Bilateral) Uterine morcellation  Discharged Condition: good   Admission Hx and PE: The patient has been followed at the [facility]. She has a history of  Fibroids, Pelvic Pain. Please see her documented history and physical exam.  She has large fibroids and pelvic pain.  Hospital course:  On the day of admission, the patient underwent the following Procedure(s): HYSTERECTOMY VAGINAL with uterine morcellation and BILATERAL SALPINGECTOMY. Operative findings included a 12-week size multi-fibroid uterus.  The uterus weighed approximately 340 g.  The fallopian tubes and ovaries were normal.. Her postoperative course was uneventful. She quickly tolerated a regular diet. Her postoperative pain was controlled with oral medication.  She tolerated hydromorphone for pain control without significant itching.  She remained afebrile.  Her postoperative hemoglobin was 10.2 (preoperative hemoglobin 11.6).  Today she was felt to be ready for discharge.  Labs:  Hemoglobin  Date Value Ref Range Status  07/19/2018 10.2 (L) 12.0 - 15.0 g/dL Final  01/13/2017 11.4 11.1 - 15.9 g/dL Final   HCT  Date Value Ref Range Status  07/19/2018 31.3 (L) 36.0 - 46.0 % Final   Hematocrit  Date Value Ref Range Status  01/13/2017 34.2 34.0 - 46.6 % Final   BP (!) 97/59 (BP Location: Left Arm)   Pulse (!) 57   Temp 98.7 F (37.1 C)   Resp 17   Ht 5' 2"  (1.575 m)   Wt 59 kg   SpO2 100%   BMI 23.80 kg/m  CBC    Component Value Date/Time   WBC 7.3 07/19/2018 0601   RBC 3.42 (L)  07/19/2018 0601   HGB 10.2 (L) 07/19/2018 0601   HGB 11.4 01/13/2017 1049   HCT 31.3 (L) 07/19/2018 0601   HCT 34.2 01/13/2017 1049   PLT 257 07/19/2018 0601   PLT 326 01/13/2017 1049   MCV 91.5 07/19/2018 0601   MCV 91 01/13/2017 1049   MCH 29.8 07/19/2018 0601   MCHC 32.6 07/19/2018 0601   RDW 11.9 07/19/2018 0601   RDW 12.8 01/13/2017 1049   HEENT: Within normal limits, no distress Chest: Clear Heart: Regular rate and rhythm Abdomen: Soft and nontender Extremities: No masses or edema Perineal pad: Minimal drainage  Consults: None  Final pathology report: Pending at the time of discharge  Disposition:  The patient will be discharged to home. She has been given a copy of the discharge instructions as prepared by the [facility] for patients who have undergone the Procedure(s): HYSTERECTOMY VAGINAL Uterine morcellation BILATERAL SALPINGECTOMY.    Allergies as of 07/19/2018      Reactions   Oxycodone Itching      Medication List    STOP taking these medications   naproxen sodium 220 MG tablet Commonly known as:  ALEVE     TAKE these medications   HYDROmorphone 2 MG tablet Commonly known as:  DILAUDID Take 1 tablet (2 mg total) by mouth every 4 (four) hours as needed for severe pain.   ibuprofen 800 MG tablet Commonly known as:  ADVIL,MOTRIN Take 1 tablet (800 mg total) by mouth every 8 (eight) hours as needed.  VISINE 0.05 % ophthalmic solution Generic drug:  tetrahydrozoline Place 1 drop into both eyes daily as needed (for dry eyes).      Follow-up Information    Ena Dawley, MD On 08/13/2018.   Specialty:  Obstetrics and Gynecology Why:  Appointment time is 10.15 a.m. Contact information: [place] [address] [phone]           Signed: Eli Hose 07/19/2018, 8:14 AM

## 2018-07-19 NOTE — Discharge Instructions (Signed)
Vaginal Hysterectomy, Care After Refer to this sheet in the next few weeks. These instructions provide you with information about caring for yourself after your procedure. Your health care provider may also give you more specific instructions. Your treatment has been planned according to current medical practices, but problems sometimes occur. Call your health care provider if you have any problems or questions after your procedure. What can I expect after the procedure? After the procedure, it is common to have:  Pain.  Soreness and numbness in your incision areas.  Vaginal bleeding and discharge.  Constipation.  Temporary problems emptying the bladder.  Feelings of sadness or other emotions.  Follow these instructions at home: Medicines  Take over-the-counter and prescription medicines only as told by your health care provider.  If you were prescribed an antibiotic medicine, take it as told by your health care provider. Do not stop taking the antibiotic even if you start to feel better.  Do not drive or operate heavy machinery while taking prescription pain medicine. Activity  Return to your normal activities as told by your health care provider. Ask your health care provider what activities are safe for you.  Get regular exercise as told by your health care provider. You may be told to take short walks every day and go farther each time.  Do not lift anything that is heavier than 10 lb (4.5 kg). General instructions   Do not put anything in your vagina for 6 weeks after your surgery or as told by your health care provider. This includes tampons and douches.  Do not have sex until your health care provider says you can.  Do not take baths, swim, or use a hot tub until your health care provider approves.  Drink enough fluid to keep your urine clear or pale yellow.  Do not drive for 24 hours if you were given a sedative.  Keep all follow-up visits as told by your health  care provider. This is important. Contact a health care provider if:  Your pain medicine is not helping.  You have a fever.  You have redness, swelling, or pain at your incision site.  You have blood, pus, or a bad-smelling discharge from your vagina.  You continue to have difficulty urinating. Get help right away if:  You have severe abdominal or back pain.  You have heavy bleeding from your vagina.  You have chest pain or shortness of breath. This information is not intended to replace advice given to you by your health care provider. Make sure you discuss any questions you have with your health care provider. Document Released: 12/21/2015 Document Revised: 02/04/2016 Document Reviewed: 09/13/2015 Elsevier Interactive Patient Education  2018 [facility] @ [phone] if:  You have a temperature greater than or equal to 100.4 degrees Farenheit orally You have pain that is not made better by the pain medication given and taken as directed You have excessive bleeding or problems urinating  Take Colace (Docusate Sodium/Stool Softener) 100 mg 2-3 times daily while taking narcotic pain medicine to avoid constipation or until bowel movements are regular. Take Ibuprofen 600 mg, with food every 6 hours for 5 days then as needed for pain  You may drive after 2 weeks You may walk up steps  You may shower  You may resume a regular diet  Do not lift over 15 pounds for 6 weeks Avoid anything in vagina for 6 weeks (or until after your post-operative visit)

## 2018-07-21 ENCOUNTER — Other Ambulatory Visit: Payer: Self-pay

## 2018-07-21 ENCOUNTER — Emergency Department (HOSPITAL_COMMUNITY)
Admission: EM | Admit: 2018-07-21 | Discharge: 2018-07-21 | Disposition: A | Discharge status: Home / Self Care | Payer: Commercial Managed Care - PPO | Attending: Emergency Medicine | Admitting: Emergency Medicine

## 2018-07-21 ENCOUNTER — Telehealth: Payer: Self-pay | Admitting: Obstetrics and Gynecology

## 2018-07-21 ENCOUNTER — Encounter (HOSPITAL_COMMUNITY): Payer: Self-pay | Admitting: Emergency Medicine

## 2018-07-21 DIAGNOSIS — Z79899 Other long term (current) drug therapy: Secondary | ICD-10-CM | POA: Insufficient documentation

## 2018-07-21 DIAGNOSIS — M79662 Pain in left lower leg: Secondary | ICD-10-CM

## 2018-07-21 LAB — CBC
HCT: 35.7 % — ABNORMAL LOW (ref 36.0–46.0)
Hemoglobin: 11.5 g/dL — ABNORMAL LOW (ref 12.0–15.0)
MCH: 29.9 pg (ref 26.0–34.0)
MCHC: 32.2 g/dL (ref 30.0–36.0)
MCV: 93 fL (ref 80.0–100.0)
Platelets: 271 10*3/uL (ref 150–400)
RBC: 3.84 MIL/uL — ABNORMAL LOW (ref 3.87–5.11)
RDW: 11.6 % (ref 11.5–15.5)
WBC: 5.7 10*3/uL (ref 4.0–10.5)
nRBC: 0 % (ref 0.0–0.2)

## 2018-07-21 LAB — BASIC METABOLIC PANEL
Anion gap: 5 (ref 5–15)
BUN: 15 mg/dL (ref 6–20)
CO2: 28 mmol/L (ref 22–32)
Calcium: 8.9 mg/dL (ref 8.9–10.3)
Chloride: 104 mmol/L (ref 98–111)
Creatinine, Ser: 0.98 mg/dL (ref 0.44–1.00)
GFR calc Af Amer: >60 mL/min (ref >60)
GFR calc non Af Amer: >60 mL/min (ref >60)
Glucose, Bld: 95 mg/dL (ref 70–99)
Potassium: 3.5 mmol/L (ref 3.5–5.1)
Sodium: 137 mmol/L (ref 135–145)

## 2018-07-21 MED ORDER — ENOXAPARIN SODIUM 100 MG/ML ~~LOC~~ SOLN
1.5000 mg/kg | Freq: Once | SUBCUTANEOUS | Status: AC
  Administered 2018-07-21: 90 mg via SUBCUTANEOUS
  Filled 2018-07-21: qty 0.9

## 2018-07-21 NOTE — ED Provider Notes (Signed)
[facility]ERG[facility] DEPARTMENT Provider Note   CSN: [id number] Arrival date & time: 07/21/18  2215     History   Chief Complaint Chief Complaint  Patient presents with  . Leg Swelling    HPI Erika Sloan is a 47 y.o. female.  Patient with recent history of hysterectomy 3 days ago presents emergency department with acute onset of left calf pain starting this morning.  Patient has been up on her feet and ambulatory.  She denies acute injury.  No significant swelling.  Pain is described as a cramp.  She does not have any previous history of blood clots or family history of blood clotting.  Patient denies any chest pain or shortness of breath.  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  Vital signs are normal on arrival.  She has been taking ibuprofen for pain status post hysterectomy.  Denies any active bleeding.       Past Medical History:  Diagnosis Date  . Anemia   . Heart murmur    07-12-2018  per pt has told at times murmur heard, but is asymptomatic  . Wears glasses     Patient Active Problem List   Diagnosis Date Noted  . 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 DILITATION & CURRETTAGE/HYSTROSCOPY WITH HYDROTHERMAL ABLATION N/A 02/15/2017   Procedure: DILATATION & CURETTAGE/HYSTEROSCOPY WITH HYDROTHERMAL ABLATION;  Surgeon: Emily Filbert, MD;  Location: [facility];  Service: Gynecology;  Laterality: N/A;  . HERNIA REPAIR  2005  . LAPAROSCOPIC CHOLECYSTECTOMY  2008 approx.  Marland Kitchen LAPAROSCOPY N/A 02/15/2017   Procedure: LAPAROSCOPY OPERATIVE WITH PERITONEAL BIOPSY;  Surgeon: Emily Filbert, MD;  Location: [facility];  Service: Gynecology;  Laterality: N/A;  . SIGMOIDOSCOPY    . UMBILICAL HERNIA REPAIR  2005  . UNILATERAL SALPINGECTOMY Bilateral 07/18/2018   Procedure: BILATERAL SALPINGECTOMY;  Surgeon: Ena Dawley, MD;  Location: [facility];  Service: Gynecology;  Laterality: Bilateral;  .  UPPER GI ENDOSCOPY    . VAGINAL HYSTERECTOMY Bilateral 07/18/2018   Procedure: HYSTERECTOMY VAGINAL;  Surgeon: Ena Dawley, MD;  Location: [facility];  Service: Gynecology;  Laterality: Bilateral;     OB History    Gravida  4   Para  3   Term  3   Preterm      AB  1   Living  3     SAB  1   TAB      Ectopic      Multiple      Live Births  3            Home Medications    Prior to Admission medications   Medication Sig Start Date End Date Taking? Authorizing Provider  HYDROmorphone (DILAUDID) 2 MG tablet Take 1 tablet (2 mg total) by mouth every 4 (four) hours as needed for severe pain. 07/19/18   Ena Dawley, MD  ibuprofen (ADVIL,MOTRIN) 800 MG tablet Take 1 tablet (800 mg total) by mouth every 8 (eight) hours as needed. 07/19/18   Ena Dawley, MD  tetrahydrozoline (VISINE) 0.05 % ophthalmic solution Place 1 drop into both eyes daily as needed (for dry eyes).    [provider]    Family History Family History  Problem Relation Age of Onset  . Breast cancer Mother   . Mitral valve prolapse Mother     Social History Social History   Tobacco Use  .  Smoking status: Never Smoker  . Smokeless tobacco: Never Used  Substance Use Topics  . Alcohol use: Yes    Comment: occasional  . Drug use: Never     Allergies   Oxycodone   Review of Systems Review of Systems  Constitutional: Negative for fever.  HENT: Negative for rhinorrhea and sore throat.   Eyes: Negative for redness.  Respiratory: Negative for cough and shortness of breath.   Cardiovascular: Negative for chest pain and leg swelling.  Gastrointestinal: Negative for abdominal pain, diarrhea, nausea and vomiting.  Genitourinary: Negative for dysuria.  Musculoskeletal: Positive for myalgias.  Skin: Negative for rash.  Neurological: Negative for headaches.     Physical Exam Updated Vital Signs BP 137/80 (BP Location: Right Arm)   Pulse 64   Temp 98.7  F (37.1 C) (Oral)   Resp 16   Ht 5' 2"  (1.575 m)   Wt 59 kg   SpO2 100%   BMI 23.78 kg/m   Physical Exam  Constitutional: She appears well-developed and well-nourished.  HENT:  Head: Normocephalic and atraumatic.  Eyes: Conjunctivae are normal. Right eye exhibits no discharge. Left eye exhibits no discharge.  Neck: Normal range of motion. Neck supple.  Cardiovascular: Normal rate, regular rhythm and normal heart sounds.  Pulmonary/Chest: Effort normal and breath sounds normal.  Abdominal: Soft. There is no tenderness.  Musculoskeletal: She exhibits tenderness. She exhibits no edema.  L calf tenderness to palpation, no gross swelling. No erythema, warmth, or cellulitis.  Neurological: She is alert.  Skin: Skin is warm and dry.  Psychiatric: She has a normal mood and affect.  Nursing note and vitals reviewed.    ED Treatments / Results  Labs (all labs ordered are listed, but only abnormal results are displayed) Labs Reviewed  CBC  BASIC METABOLIC PANEL    EKG None  Radiology No results found.  Procedures Procedures (including critical care time)  Medications Ordered in ED Medications - No data to display   Initial Impression / Assessment and Plan / ED Course  I have reviewed the triage vital signs and the nursing notes.  Pertinent labs & imaging results that were available during my care of the patient were reviewed by me and considered in my medical decision making (see chart for details).     Patient seen and examined.  Will check basic labs.  Plan is for dose of Lovenox tonight and outpatient ultrasound in the morning.  Patient agreeable.  If no concerns for PE at this time.  Patient is not hypoxic and not tachycardic.  Normal blood pressure.  Vital signs reviewed and are as follows: BP 137/80 (BP Location: Right Arm)   Pulse 64   Temp 98.7 F (37.1 C) (Oral)   Resp 16   Ht 5' 2"  (1.575 m)   Wt 59 kg   SpO2 100%   BMI 23.78 kg/m   11:14 PM labs  are reviewed.  Slightly low hemoglobin improved from after her surgery.  Patient has received her Lovenox.  Instructions given to return to the emergency department registration desk at 8 AM tomorrow for her vascular study.  Order placed.  Final Clinical Impressions(s) / ED Diagnoses   Final diagnoses:  Pain of left calf   Patient with left calf pain.  Recent surgery.  Concern for DVT.  No signs of PE today.  Vital signs are reassuring.  Patient given a dose of Lovenox and will be seen tomorrow for her ultrasound.  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   ED Discharge Orders         Ordered    LE VENOUS     07/21/18 2312           Carlisle Cater, PA-C 07/21/18 2315    Jola Schmidt, MD 07/21/18 2320

## 2018-07-21 NOTE — Discharge Instructions (Signed)
Please read and follow all provided instructions.  Your diagnoses today include:  1. Pain of left calf     Tests performed today include:  Blood counts and electrolytes -slightly low red blood cells but improved from when he had surgery  Vital signs. See below for your results today.   Medications prescribed:   None  Take any prescribed medications only as directed.  Additional Information:  Follow any educational materials contained in this packet.  Although you appear stable for discharge, you may still have a blood clot in your leg(s) called a DVT (deep venous thrombosis). You may have received initial treatment with an injection of a blood thinner to treat DVTs, but low risk patients do not always get treated before an ultrasound is performed to diagnose or rule out a DVT, due to the risks of bleeding from the medication used. It is important you follow up for an ultrasound within one day as directed.  Follow-up instructions: Please return to the hospital tomorrow morning for your ultrasound as directed.  Return instructions:   Please return to the Emergency Department if you experience worsening symptoms.  Return immediately if you develop chest pain, shortness of breath, dizziness or fainting.  Return with new color change or weakness/numbness to your affected leg(s).  Return with bleeding, severe headache, confusion or altered mental status.  Please return if you have any other emergent concerns.  Additional Information:  Your vital signs today were: BP (!) 148/81    Pulse 68    Temp 98.7 F (37.1 C) (Oral)    Resp 16    Ht 5' 2"  (1.575 m)    Wt 59 kg    SpO2 100%    BMI 23.78 kg/m  If your blood pressure (BP) was elevated above 135/85 this visit, please have this repeated by your doctor within one month. --------------

## 2018-07-21 NOTE — ED Triage Notes (Signed)
Per pt she had a hysterectomy on 07/18/18 and was feeling fine until today and then started having leg cramp in the left calf pain. Pt is alert oriented x 4

## 2018-07-21 NOTE — Telephone Encounter (Signed)
Phone call returned to patient after hours.   Patient is status post vaginal hysterectomy on 07/18/18 and is reporting persistent left calf cramping.  Denies swelling.  Took Ibuprofen today with no relief of the pain.  Denies shortness of breath.   I recommended she go to [facility] for evaluation of possible DVT.  She states her husband can bring her to the hospital.   ER called to inform them of her upcoming arrival.

## 2018-07-22 ENCOUNTER — Ambulatory Visit (HOSPITAL_COMMUNITY)
Admission: RE | Admit: 2018-07-22 | Discharge: 2018-07-22 | Disposition: A | Discharge status: Home / Self Care | Payer: Commercial Managed Care - PPO | Source: Ambulatory Visit | Attending: Emergency Medicine | Admitting: Emergency Medicine

## 2018-07-22 DIAGNOSIS — R52 Pain, unspecified: Secondary | ICD-10-CM

## 2018-07-22 DIAGNOSIS — M79662 Pain in left lower leg: Secondary | ICD-10-CM | POA: Diagnosis not present

## 2018-07-22 NOTE — Telephone Encounter (Signed)
No DVT or SVT noted.

## 2018-07-22 NOTE — Progress Notes (Signed)
VASCULAR LAB PRELIMINARY  PRELIMINARY  PRELIMINARY  PRELIMINARY  Left lower extremity venous duplex completed.    Preliminary report:  There is no DVT or SVT noted in the left lower extremity.  Valli Randol, RVT 07/22/2018, 8:31 AM

## 2018-11-27 ENCOUNTER — Other Ambulatory Visit: Payer: Self-pay | Admitting: Family Medicine

## 2018-11-27 DIAGNOSIS — Z1231 Encounter for screening mammogram for malignant neoplasm of breast: Secondary | ICD-10-CM

## 2019-01-04 ENCOUNTER — Ambulatory Visit: Payer: No Typology Code available for payment source

## 2019-02-28 ENCOUNTER — Ambulatory Visit: Payer: No Typology Code available for payment source

## 2019-03-26 ENCOUNTER — Ambulatory Visit: Payer: No Typology Code available for payment source

## 2019-04-16 ENCOUNTER — Other Ambulatory Visit: Payer: Self-pay

## 2019-04-16 ENCOUNTER — Ambulatory Visit
Admission: RE | Admit: 2019-04-16 | Discharge: 2019-04-16 | Disposition: A | Discharge status: Home / Self Care | Payer: Commercial Managed Care - PPO | Source: Ambulatory Visit | Attending: Family Medicine | Admitting: Family Medicine

## 2019-04-16 DIAGNOSIS — Z1231 Encounter for screening mammogram for malignant neoplasm of breast: Secondary | ICD-10-CM

## 2020-05-21 ENCOUNTER — Encounter (HOSPITAL_COMMUNITY): Payer: Self-pay

## 2020-05-21 ENCOUNTER — Emergency Department (HOSPITAL_COMMUNITY): Payer: Commercial Managed Care - PPO

## 2020-05-21 ENCOUNTER — Emergency Department (HOSPITAL_COMMUNITY)
Admission: EM | Admit: 2020-05-21 | Discharge: 2020-05-21 | Disposition: A | Discharge status: Home / Self Care | Payer: Commercial Managed Care - PPO | Attending: Emergency Medicine | Admitting: Emergency Medicine

## 2020-05-21 ENCOUNTER — Other Ambulatory Visit: Payer: Self-pay

## 2020-05-21 DIAGNOSIS — Y939 Activity, unspecified: Secondary | ICD-10-CM | POA: Insufficient documentation

## 2020-05-21 DIAGNOSIS — M25521 Pain in right elbow: Secondary | ICD-10-CM | POA: Insufficient documentation

## 2020-05-21 DIAGNOSIS — M79631 Pain in right forearm: Secondary | ICD-10-CM | POA: Insufficient documentation

## 2020-05-21 DIAGNOSIS — R200 Anesthesia of skin: Secondary | ICD-10-CM | POA: Diagnosis not present

## 2020-05-21 DIAGNOSIS — R531 Weakness: Secondary | ICD-10-CM | POA: Insufficient documentation

## 2020-05-21 DIAGNOSIS — S14109A Unspecified injury at unspecified level of cervical spinal cord, initial encounter: Secondary | ICD-10-CM

## 2020-05-21 DIAGNOSIS — Y929 Unspecified place or not applicable: Secondary | ICD-10-CM | POA: Insufficient documentation

## 2020-05-21 DIAGNOSIS — S161XXA Strain of muscle, fascia and tendon at neck level, initial encounter: Secondary | ICD-10-CM | POA: Diagnosis not present

## 2020-05-21 DIAGNOSIS — W19XXXA Unspecified fall, initial encounter: Secondary | ICD-10-CM

## 2020-05-21 DIAGNOSIS — M79621 Pain in right upper arm: Secondary | ICD-10-CM | POA: Insufficient documentation

## 2020-05-21 DIAGNOSIS — Y999 Unspecified external cause status: Secondary | ICD-10-CM | POA: Diagnosis not present

## 2020-05-21 DIAGNOSIS — M25511 Pain in right shoulder: Secondary | ICD-10-CM | POA: Insufficient documentation

## 2020-05-21 DIAGNOSIS — S199XXA Unspecified injury of neck, initial encounter: Secondary | ICD-10-CM | POA: Diagnosis present

## 2020-05-21 MED ORDER — METHYLPREDNISOLONE 4 MG PO TBPK: ORAL_TABLET | ORAL | 0 refills | Status: AC

## 2020-05-21 MED ORDER — DEXAMETHASONE SODIUM PHOSPHATE 10 MG/ML IJ SOLN
10.0000 mg | Freq: Once | INTRAMUSCULAR | Status: AC
  Administered 2020-05-21: 10 mg via INTRAVENOUS
  Filled 2020-05-21: qty 1

## 2020-05-21 MED ORDER — KETOROLAC TROMETHAMINE 30 MG/ML IJ SOLN
15.0000 mg | Freq: Once | INTRAMUSCULAR | Status: AC
  Administered 2020-05-21: 15 mg via INTRAVENOUS
  Filled 2020-05-21: qty 1

## 2020-05-21 MED ORDER — ONDANSETRON HCL 4 MG/2ML IJ SOLN
4.0000 mg | Freq: Once | INTRAMUSCULAR | Status: AC
  Administered 2020-05-21: 4 mg via INTRAVENOUS
  Filled 2020-05-21: qty 2

## 2020-05-21 MED ORDER — FENTANYL CITRATE (PF) 100 MCG/2ML IJ SOLN
50.0000 ug | INTRAMUSCULAR | Status: DC | PRN
  Administered 2020-05-21 (×2): 50 ug via INTRAVENOUS
  Filled 2020-05-21 (×2): qty 2

## 2020-05-21 MED ORDER — GABAPENTIN 300 MG PO CAPS: 300.0000 mg | ORAL_CAPSULE | Freq: Three times a day (TID) | ORAL | 0 refills | Status: AC

## 2020-05-21 MED ORDER — HYDROCODONE-ACETAMINOPHEN 5-325 MG PO TABS: 1.0000 | ORAL_TABLET | Freq: Four times a day (QID) | ORAL | 0 refills | Status: AC | PRN

## 2020-05-21 NOTE — Consult Note (Signed)
Providing Compassionate, Quality Care - Together  Reason for Consult: thrown from a horse Referring Physician:   [name] is an 49 y.o. female.  HPI: Erika Sloan is a 49 year-old, right handed individual, who presents to the ED via EMS after a fall from being thrown from a horse. She was thrown forward off her horse, landing with her head possibly flexed and rotated to the right.  Pt denies loss of consciousness. Patient reports that she had transient paralysis initially, roughly 5 minutes, but was able to move all her extremities afterward.  Pt reports pain on top of her right shoulder radiates down posterior to her right forearm. Patient describes throbbing pain,  10/10, severe in intensity, worse with movements. Pt denies numbness and tingling, or weakness. Denies neck pain. No other areas of weakness.   Past Medical History:  Diagnosis Date  . Anemia   . Heart murmur    07-12-2018  per pt has told at times murmur heard, but is asymptomatic  . Wears glasses     Past Surgical History:  Procedure Laterality Date  . DILITATION & CURRETTAGE/HYSTROSCOPY WITH HYDROTHERMAL ABLATION N/A 02/15/2017   Procedure: DILATATION & CURETTAGE/HYSTEROSCOPY WITH HYDROTHERMAL ABLATION;  Surgeon: Emily Filbert, MD;  Location: [facility];  Service: Gynecology;  Laterality: N/A;  . HERNIA REPAIR  2005  . LAPAROSCOPIC CHOLECYSTECTOMY  2008 approx.  Marland Kitchen LAPAROSCOPY N/A 02/15/2017   Procedure: LAPAROSCOPY OPERATIVE WITH PERITONEAL BIOPSY;  Surgeon: Emily Filbert, MD;  Location: [facility];  Service: Gynecology;  Laterality: N/A;  . SIGMOIDOSCOPY    . UMBILICAL HERNIA REPAIR  2005  . UNILATERAL SALPINGECTOMY Bilateral 07/18/2018   Procedure: BILATERAL SALPINGECTOMY;  Surgeon: Ena Dawley, MD;  Location: [facility];  Service: Gynecology;  Laterality: Bilateral;  . UPPER GI ENDOSCOPY    . VAGINAL HYSTERECTOMY Bilateral 07/18/2018   Procedure: HYSTERECTOMY VAGINAL;  Surgeon: Ena Dawley, MD;   Location: [facility];  Service: Gynecology;  Laterality: Bilateral;    Family History  Problem Relation Age of Onset  . Breast cancer Mother   . Mitral valve prolapse Mother     Social History:  reports that she has never smoked. She has never used smokeless tobacco. She reports current alcohol use. She reports that she does not use drugs.  Allergies:  Allergies  Allergen Reactions  . Oxycodone Itching  . Adhesive [Tape] Rash    Medications: I have reviewed the patient's current medications.  No results found for this or any previous visit (from the past 48 hour(s)).  DG Chest 1 View  Result Date: 05/21/2020 CLINICAL DATA:  Fall from horse. Diffuse right upper extremity pain from the shoulder to the wrist. EXAM: CHEST  1 VIEW COMPARISON:  None. FINDINGS: The cardiomediastinal contours are normal. The lungs are clear. Pulmonary vasculature is normal. No consolidation, pleural effusion, or pneumothorax. No acute osseous abnormalities are seen. IMPRESSION: No acute findings or evidence of acute traumatic injury. Electronically Signed   By: Keith Rake M.D.   On: 05/21/2020 17:11   DG Pelvis 1-2 Views  Result Date: 05/21/2020 CLINICAL DATA:  Fall from horse landing on right side. EXAM: PELVIS - 1-2 VIEW COMPARISON:  None. FINDINGS: The cortical margins of the bony pelvis are intact. No fracture. Pubic symphysis and sacroiliac joints are congruent. Both femoral heads are well-seated in the respective acetabula. IMPRESSION: No pelvic fracture. Electronically Signed   By: Keith Rake M.D.   On: 05/21/2020 17:10   DG Shoulder  Right  Result Date: 05/21/2020 CLINICAL DATA:  Fall from horse. Diffuse right upper extremity pain from the shoulder to the wrist. EXAM: RIGHT SHOULDER - 2+ VIEW COMPARISON:  None. FINDINGS: There is no evidence of fracture or dislocation. There is no evidence of arthropathy or other focal bone abnormality. Soft tissues are unremarkable.  IMPRESSION: Negative radiographs of the right shoulder. Electronically Signed   By: Keith Rake M.D.   On: 05/21/2020 17:09   DG Elbow Complete Right  Result Date: 05/21/2020 CLINICAL DATA:  Fall from horse. Diffuse right upper extremity pain from the shoulder to the wrist. EXAM: RIGHT ELBOW - COMPLETE 3+ VIEW COMPARISON:  None. FINDINGS: There is no evidence of fracture, dislocation, or joint effusion. There is no evidence of arthropathy or other focal bone abnormality. Soft tissues are unremarkable. IMPRESSION: Negative radiographs of the right elbow. Electronically Signed   By: Keith Rake M.D.   On: 05/21/2020 17:08   DG Forearm Right  Result Date: 05/21/2020 CLINICAL DATA:  Fall from horse. Diffuse right upper extremity pain from the shoulder to the wrist. EXAM: RIGHT FOREARM - 2 VIEW COMPARISON:  None. FINDINGS: Cortical margins of the radius and ulna are intact. There is no evidence of fracture or other focal bone lesions. Soft tissues are unremarkable. IMPRESSION: Negative radiographs of the right forearm. Electronically Signed   By: Keith Rake M.D.   On: 05/21/2020 17:08   CT Head Wo Contrast  Result Date: 05/21/2020 CLINICAL DATA:  Trauma.  Fall from horse. EXAM: CT HEAD WITHOUT CONTRAST TECHNIQUE: Contiguous axial images were obtained from the base of the skull through the vertex without intravenous contrast. COMPARISON:  None. FINDINGS: Brain: No evidence of acute large vascular territory infarction, acute hemorrhage, hydrocephalus, extra-axial collection or mass lesion/mass effect. Vascular: No hyperdense vessel or unexpected calcification. Skull: Normal. Negative for fracture or focal lesion. Sinuses/Orbits: No acute finding. Other: No mastoid effusions. IMPRESSION: No acute intracranial abnormality. Electronically Signed   By: Margaretha Sheffield MD   On: 05/21/2020 16:35   CT Cervical Spine Wo Contrast  Result Date: 05/21/2020 CLINICAL DATA:  Poly trauma with neck pain.   Fall off horse. EXAM: CT CERVICAL SPINE WITHOUT CONTRAST TECHNIQUE: Multidetector CT imaging of the cervical spine was performed without intravenous contrast. Multiplanar CT image reconstructions were also generated. COMPARISON:  None. FINDINGS: Alignment: Mild retrolisthesis of C3 on C4 and C4 on C5, favor degenerative given degenerative changes at these levels. Skull base and vertebrae: There is partial ankylosis across the C5-C6 disc space and posterior elements with a "waisted "appearance the vertebral bodies, compatible with a segmentation anomaly. Soft tissues and spinal canal: No prevertebral fluid or swelling. No large visible canal hematoma. Disc levels: Moderate degenerative disc disease at C3-C4 where there is degenerative disc height loss, posterior disc osteophyte complex and bilateral facet/uncovertebral hypertrophy. Resulting severe left foraminal stenosis and possibly severe canal stenosis at this level. Right eccentric disc herniation at C4-C5. Upper chest: Negative. Other: Approximately 1.0 cm right thyroid nodule, which is not require further imaging follow-up per ACR guidelines. IMPRESSION: 1. No evidence of acute fracture or traumatic malalignment. 2. Multilevel degenerative changes, greatest at C3-C4 where there is moderate to severe left foraminal stenosis and likely severe canal stenosis with a central disc herniation. MRI could better evaluate the canal/cord/foramina if clinically indicated. 3. C5-C6 segmentation anomaly with partial ankylosis. Electronically Signed   By: Margaretha Sheffield MD   On: 05/21/2020 16:46   MR Cervical Spine Wo Contrast  Addendum  Date: 05/21/2020   ADDENDUM REPORT: 05/21/2020 21:21 ADDENDUM: Study discussed by telephone with Dr. Kristeen Miss on 05/21/2020 at 2104 hours. He advises that by report from the ED, the patient has an acute right C5 deficit. We discussed the moderate spinal cord mass effect at C3-C4, but on further review of the exam I find no convincing  signs of acute cord contusion. Additionally, I see no secondary evidence of a right C5 nerve root avulsion (e.g. no extradural fluid or blood in the cervical spine). Electronically Signed   By: Genevie Ann M.D.   On: 05/21/2020 21:21   Result Date: 05/21/2020 CLINICAL DATA:  49 year old female status post fall from horse. Neck pain. Moderate to severe spinal stenosis suspected on cervical spine CT earlier today. EXAM: MRI CERVICAL SPINE WITHOUT CONTRAST TECHNIQUE: Multiplanar, multisequence MR imaging of the cervical spine was performed. No intravenous contrast was administered. COMPARISON:  Cervical spine CT 1626 hours today. FINDINGS: Alignment: Stable straightening of cervical lordosis. Vertebrae: No marrow edema or evidence of acute osseous abnormality. Visualized bone marrow signal is within normal limits. Congenital incomplete segmentation of C5-C6 again noted. Cord: a no cervical spinal cord signal abnormality despite multilevel spinal stenosis with cord mass effect, detailed below. No evidence of spinal cord blood products on axial T2 * imaging. Posterior Fossa, vertebral arteries, paraspinal tissues: Cervicomedullary junction is within normal limits. Grossly negative visible brain parenchyma. Preserved major vascular flow voids in the neck. The right vertebral artery appears dominant. Abnormal prevertebral fluid or signal at the C5-C6 level (series 9, image 32 and series 7, image 7). But no other signal abnormality identified in the cervical spine ligamentous complex. Disc levels: C2-C3: Mild endplate spurring, posterior element hypertrophy. No stenosis. C3-C4: Disc space loss with bulky circumferential disc osteophyte complex. Small superimposed central component of disc. Moderate spinal stenosis (series 10, image 21) and spinal cord mass effect. Severe left and moderate right C4 foraminal stenosis. C4-C5: Circumferential disc bulge and endplate spurring with the lobulated posterior component (series 10,  image 25). Mild spinal stenosis and spinal cord mass effect. Moderate to severe left C5 foraminal stenosis. C5-C6:  Incompletely segmented. No stenosis. C6-C7: Disc space loss with circumferential disc osteophyte complex eccentric to the right. Borderline to mild spinal stenosis. No cord mass effect. Mild to moderate left and moderate to severe right C7 foraminal stenosis. C7-T1: Mild disc bulging eccentric to the left. Mild facet hypertrophy. No stenosis. Negative visible upper thoracic levels. IMPRESSION: 1. 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 But no other ligamentous injury identified in the cervical spine. 2. Advanced disc and endplate degeneration at C3-C4, C4-C5 with up to moderate spinal stenosis and spinal cord mass effect. No cord signal abnormality. Borderline to mild spinal stenosis at C6-C7. Moderate or severe degenerative neural foraminal stenosis also at the left C4, left C5 and right C7 nerve levels. Electronically Signed: By: Genevie Ann M.D. On: 05/21/2020 19:51   DG Humerus Right  Result Date: 05/21/2020 CLINICAL DATA:  Fall from horse. Diffuse right upper extremity pain from the shoulder to the wrist. EXAM: RIGHT HUMERUS - 2+ VIEW COMPARISON:  None. FINDINGS: The cortical margins of the humerus are intact. There is no evidence of fracture or other focal bone lesions. Soft tissues are unremarkable. IMPRESSION: Negative radiographs of the right humerus. Electronically Signed   By: Keith Rake M.D.   On: 05/21/2020 17:09    Review of Systems  Constitutional: Negative.   Respiratory:  Negative.   Cardiovascular: Negative.   Musculoskeletal:       Positive right shoulder pain to her right forearm  Neurological: Negative.    Blood pressure 128/82, pulse 76, temperature 98.7 F (37.1 C), temperature source Oral, resp. rate 16, height 5' 3"  (1.6 m), weight 57.6 kg, last menstrual period 06/14/2018, SpO2 98  %. Physical Exam Constitutional:      Appearance: Normal appearance.  Neck:     Comments: Currently wearing a cervical collar  Cardiovascular:     Rate and Rhythm: Normal rate.  Pulmonary:     Effort: Pulmonary effort is normal.  Neurological:     General: No focal deficit present.     Mental Status: She is alert and oriented to person, place, and time.   Musculoskeletal: Right shoulder:  Very tender to touch. Decreased range of motion Motor strength in the right upper extremities reveals deltoids, biceps, triceps, and grips- all have 4/5. Left upper extremities 5/5. Able to move lower extremities w/o any difficulty.   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 (personally reviewed by Dr. Ellene Route). She will ultimately need surgical intervention at C3-4, however we can treat it with conservative treatment for now.  -Will follow up with patient next week at [facility] -fit pt for soft cervical collar -steri pack -Gabapentin 300 mg capsule 3 times daily   Osie Cheeks 05/21/2020, 10:41 PM

## 2020-05-21 NOTE — ED Notes (Signed)
Soft c-collar put on by ortho- gave pt dc papers- rx sent to pharmacy- instructed pt to follow-up w neurosurgery on 9/15- directions and ph num on papers- pt understood, no questions, ready for dc

## 2020-05-21 NOTE — ED Notes (Signed)
Patient transported to MRI 

## 2020-05-21 NOTE — Discharge Instructions (Signed)
As discussed, it is important that you monitor your condition carefully, and do not hesitate to return here for concerning changes.  Otherwise, please be sure to follow-up with our neurosurgery colleague next week.

## 2020-05-21 NOTE — ED Triage Notes (Addendum)
Pt presents after being thrown off horse with complaints of cervical spine and right arm/ shoulder pain. Pt reports she experienced complete paralysis initially, but since has improved and can move all extremities. PT states she was unable to feel anything from the neck down initially after accident.  Denies LOC

## 2020-05-21 NOTE — Progress Notes (Signed)
Orthopedic Tech Progress Note Patient Details:  Erika Sloan Dec 11, 1970 [id number]  Ortho Devices Type of Ortho Device: Soft collar Ortho Device/Splint Interventions: Application   Post Interventions Patient Tolerated: Well Instructions Provided: Adjustment of device   Austin E Bullins 05/21/2020, 11:45 PM

## 2020-05-21 NOTE — ED Notes (Signed)
Ortho in room to put on soft c-collar

## 2020-05-21 NOTE — ED Provider Notes (Signed)
[facility] DEPARTMENT Provider Note   CSN: [id number] Arrival date & time: 05/21/20  1534     History Chief Complaint  Patient presents with  . Neck Pain  . Arm Pain    Erika Sloan is a 49 y.o. female.  HPI     Previously well female presents after a fall from her horse, now with concern for right upper extremity pain, and transient paralysis following fall. Patient is awake, alert, arriving via EMS. History is obtained by the patient and EMS providers. She notes that using of a ride she was thrown forward off her horse, landing with her head awkwardly, possibly flexed, rotated to the right. She felt as though she cannot move anything after landing, but subsequently has had return of sensation and motor in all extremities. She does have ongoing persistent sharp severe pain throughout the right upper extremity, worse with any motion or palpation. EMS providers report no hemodynamic instability in route. She had some improvement in her pain with 50 mcg of fentanyl.  Past Medical History:  Diagnosis Date  . Anemia   . Heart murmur    07-12-2018  per pt has told at times murmur heard, but is asymptomatic  . Wears glasses     Patient Active Problem List   Diagnosis Date Noted  . 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 DILITATION & CURRETTAGE/HYSTROSCOPY WITH HYDROTHERMAL ABLATION N/A 02/15/2017   Procedure: DILATATION & CURETTAGE/HYSTEROSCOPY WITH HYDROTHERMAL ABLATION;  Surgeon: Emily Filbert, MD;  Location: [facility];  Service: Gynecology;  Laterality: N/A;  . HERNIA REPAIR  2005  . LAPAROSCOPIC CHOLECYSTECTOMY  2008 approx.  Marland Kitchen LAPAROSCOPY N/A 02/15/2017   Procedure: LAPAROSCOPY OPERATIVE WITH PERITONEAL BIOPSY;  Surgeon: Emily Filbert, MD;  Location: [facility];  Service: Gynecology;  Laterality: N/A;  . SIGMOIDOSCOPY    . UMBILICAL HERNIA REPAIR  2005  . UNILATERAL SALPINGECTOMY Bilateral 07/18/2018    Procedure: BILATERAL SALPINGECTOMY;  Surgeon: Ena Dawley, MD;  Location: [facility];  Service: Gynecology;  Laterality: Bilateral;  . UPPER GI ENDOSCOPY    . VAGINAL HYSTERECTOMY Bilateral 07/18/2018   Procedure: HYSTERECTOMY VAGINAL;  Surgeon: Ena Dawley, MD;  Location: [facility];  Service: Gynecology;  Laterality: Bilateral;     OB History    Gravida  4   Para  3   Term  3   Preterm      AB  1   Living  3     SAB  1   TAB      Ectopic      Multiple      Live Births  3           Family History  Problem Relation Age of Onset  . Breast cancer Mother   . Mitral valve prolapse Mother     Social History   Tobacco Use  . Smoking status: Never Smoker  . Smokeless tobacco: Never Used  Vaping Use  . Vaping Use: Never used  Substance Use Topics  . Alcohol use: Yes    Comment: occasional  . Drug use: Never    Home Medications Prior to Admission medications   Medication Sig Start Date End Date Taking? Authorizing Provider  BIOTIN PO Take 1 tablet by mouth daily with breakfast.   Yes [provider]  diclofenac (CATAFLAM) 50 MG tablet Take 50 mg by mouth 2 (two) times daily as needed (for pain).  03/21/20  Yes [provider]  naproxen sodium (ALEVE) 220 MG tablet Take 220-440 mg by mouth 2 (two) times daily as needed (for pain).   Yes [provider]  tetrahydrozoline (VISINE) 0.05 % ophthalmic solution Place 1 drop into both eyes as needed (for dry eyes).    Yes [provider]  VITAMIN A PO Take 1 capsule by mouth 2 (two) times a week.   Yes [provider]  Vitamin D, Ergocalciferol, (DRISDOL) 1.25 MG (50000 UNIT) CAPS capsule Take 50,000 Units by mouth every Saturday. 03/16/20  Yes [provider]  gabapentin (NEURONTIN) 300 MG capsule Take 1 capsule (300 mg total) by mouth 3 (three) times daily for 10 days. 05/21/20 05/31/20  Carmin Muskrat, MD    HYDROcodone-acetaminophen (NORCO/VICODIN) 5-325 MG tablet Take 1 tablet by mouth every 6 (six) hours as needed for severe pain. 05/21/20   Carmin Muskrat, MD  methylPREDNISolone (MEDROL DOSEPAK) 4 MG TBPK tablet Please take the methylprednisolone Dosepak as instructed on the packaging 05/21/20   Carmin Muskrat, MD    Allergies    Oxycodone and Adhesive [tape]  Review of Systems   Review of Systems  Constitutional:       Per HPI, otherwise negative  HENT:       Per HPI, otherwise negative  Respiratory:       Per HPI, otherwise negative  Cardiovascular:       Per HPI, otherwise negative  Gastrointestinal: Negative for vomiting.  Endocrine:       Negative aside from HPI  Genitourinary:       Neg aside from HPI   Musculoskeletal:       Per HPI, otherwise negative  Skin: Negative.  Negative for wound.  Neurological: Positive for weakness and numbness. Negative for syncope.    Physical Exam Updated Vital Signs BP 136/78 (BP Location: Left Arm)   Pulse 80   Temp 98.9 F (37.2 C) (Oral)   Resp 16   Ht 5' 3"  (1.6 m)   Wt 57.6 kg   LMP 06/14/2018 (Approximate) Comment: per pt when has periods only spotting since ablation 06/ 2018  SpO2 100%   BMI 22.50 kg/m   Physical Exam Vitals and nursing note reviewed.  Constitutional:      General: She is not in acute distress.    Appearance: She is well-developed.  HENT:     Head: Normocephalic and atraumatic.  Eyes:     Conjunctiva/sclera: Conjunctivae normal.  Neck:     Comments: Cervical collar in place, no gross deformities, though EMS reports the patient had possible step-off on their initial evaluation prior to application of the collar. Cardiovascular:     Rate and Rhythm: Normal rate and regular rhythm.     Pulses: Normal pulses.  Pulmonary:     Effort: Pulmonary effort is normal. No respiratory distress.     Breath sounds: Normal breath sounds. No stridor.  Abdominal:     General: There is no distension.   Musculoskeletal:     Right shoulder: Tenderness and bony tenderness present. Decreased range of motion.     Left shoulder: Normal.     Right upper arm: Tenderness and bony tenderness present.     Right elbow: Decreased range of motion. Tenderness present in radial head, medial epicondyle, lateral epicondyle and olecranon process.     Right forearm: Tenderness and bony tenderness present.     Right wrist: Normal.     Left wrist: Normal.     Comments:  Pelvis stable, no tenderness to palpation, patient flexes each hip independently, without reported discomfort.  Skin:    General: Skin is warm and dry.  Neurological:     Mental Status: She is alert and oriented to person, place, and time.     Cranial Nerves: No cranial nerve deficit.     Motor: No weakness, tremor or atrophy.      ED Results / Procedures / Treatments    Radiology DG Chest 1 View  Result Date: 05/21/2020 CLINICAL DATA:  Fall from horse. Diffuse right upper extremity pain from the shoulder to the wrist. EXAM: CHEST  1 VIEW COMPARISON:  None. FINDINGS: The cardiomediastinal contours are normal. The lungs are clear. Pulmonary vasculature is normal. No consolidation, pleural effusion, or pneumothorax. No acute osseous abnormalities are seen. IMPRESSION: No acute findings or evidence of acute traumatic injury. Electronically Signed   By: Keith Rake M.D.   On: 05/21/2020 17:11   DG Pelvis 1-2 Views  Result Date: 05/21/2020 CLINICAL DATA:  Fall from horse landing on right side. EXAM: PELVIS - 1-2 VIEW COMPARISON:  None. FINDINGS: The cortical margins of the bony pelvis are intact. No fracture. Pubic symphysis and sacroiliac joints are congruent. Both femoral heads are well-seated in the respective acetabula. IMPRESSION: No pelvic fracture. Electronically Signed   By: Keith Rake M.D.   On: 05/21/2020 17:10   DG Shoulder Right  Result Date: 05/21/2020 CLINICAL DATA:  Fall from horse. Diffuse right upper extremity pain  from the shoulder to the wrist. EXAM: RIGHT SHOULDER - 2+ VIEW COMPARISON:  None. FINDINGS: There is no evidence of fracture or dislocation. There is no evidence of arthropathy or other focal bone abnormality. Soft tissues are unremarkable. IMPRESSION: Negative radiographs of the right shoulder. Electronically Signed   By: Keith Rake M.D.   On: 05/21/2020 17:09   DG Elbow Complete Right  Result Date: 05/21/2020 CLINICAL DATA:  Fall from horse. Diffuse right upper extremity pain from the shoulder to the wrist. EXAM: RIGHT ELBOW - COMPLETE 3+ VIEW COMPARISON:  None. FINDINGS: There is no evidence of fracture, dislocation, or joint effusion. There is no evidence of arthropathy or other focal bone abnormality. Soft tissues are unremarkable. IMPRESSION: Negative radiographs of the right elbow. Electronically Signed   By: Keith Rake M.D.   On: 05/21/2020 17:08   DG Forearm Right  Result Date: 05/21/2020 CLINICAL DATA:  Fall from horse. Diffuse right upper extremity pain from the shoulder to the wrist. EXAM: RIGHT FOREARM - 2 VIEW COMPARISON:  None. FINDINGS: Cortical margins of the radius and ulna are intact. There is no evidence of fracture or other focal bone lesions. Soft tissues are unremarkable. IMPRESSION: Negative radiographs of the right forearm. Electronically Signed   By: Keith Rake M.D.   On: 05/21/2020 17:08   CT Head Wo Contrast  Result Date: 05/21/2020 CLINICAL DATA:  Trauma.  Fall from horse. EXAM: CT HEAD WITHOUT CONTRAST TECHNIQUE: Contiguous axial images were obtained from the base of the skull through the vertex without intravenous contrast. COMPARISON:  None. FINDINGS: Brain: No evidence of acute large vascular territory infarction, acute hemorrhage, hydrocephalus, extra-axial collection or mass lesion/mass effect. Vascular: No hyperdense vessel or unexpected calcification. Skull: Normal. Negative for fracture or focal lesion. Sinuses/Orbits: No acute finding. Other: No  mastoid effusions. IMPRESSION: No acute intracranial abnormality. Electronically Signed   By: Margaretha Sheffield MD   On: 05/21/2020 16:35   CT Cervical Spine Wo Contrast  Result Date: 05/21/2020 CLINICAL DATA:  Poly trauma with neck pain.  Fall off horse. EXAM: CT CERVICAL SPINE WITHOUT CONTRAST TECHNIQUE: Multidetector CT imaging of the cervical spine was performed without intravenous contrast. Multiplanar CT image reconstructions were also generated. COMPARISON:  None. FINDINGS: Alignment: Mild retrolisthesis of C3 on C4 and C4 on C5, favor degenerative given degenerative changes at these levels. Skull base and vertebrae: There is partial ankylosis across the C5-C6 disc space and posterior elements with a "waisted "appearance the vertebral bodies, compatible with a segmentation anomaly. Soft tissues and spinal canal: No prevertebral fluid or swelling. No large visible canal hematoma. Disc levels: Moderate degenerative disc disease at C3-C4 where there is degenerative disc height loss, posterior disc osteophyte complex and bilateral facet/uncovertebral hypertrophy. Resulting severe left foraminal stenosis and possibly severe canal stenosis at this level. Right eccentric disc herniation at C4-C5. Upper chest: Negative. Other: Approximately 1.0 cm right thyroid nodule, which is not require further imaging follow-up per ACR guidelines. IMPRESSION: 1. No evidence of acute fracture or traumatic malalignment. 2. Multilevel degenerative changes, greatest at C3-C4 where there is moderate to severe left foraminal stenosis and likely severe canal stenosis with a central disc herniation. MRI could better evaluate the canal/cord/foramina if clinically indicated. 3. C5-C6 segmentation anomaly with partial ankylosis. Electronically Signed   By: Margaretha Sheffield MD   On: 05/21/2020 16:46   MR Cervical Spine Wo Contrast  Addendum Date: 05/21/2020   ADDENDUM REPORT: 05/21/2020 21:21 ADDENDUM: Study discussed by telephone  with Dr. Kristeen Miss on 05/21/2020 at 2104 hours. He advises that by report from the ED, the patient has an acute right C5 deficit. We discussed the moderate spinal cord mass effect at C3-C4, but on further review of the exam I find no convincing signs of acute cord contusion. Additionally, I see no secondary evidence of a right C5 nerve root avulsion (e.g. no extradural fluid or blood in the cervical spine). Electronically Signed   By: Genevie Ann M.D.   On: 05/21/2020 21:21   Result Date: 05/21/2020 CLINICAL DATA:  49 year old female status post fall from horse. Neck pain. Moderate to severe spinal stenosis suspected on cervical spine CT earlier today. EXAM: MRI CERVICAL SPINE WITHOUT CONTRAST TECHNIQUE: Multiplanar, multisequence MR imaging of the cervical spine was performed. No intravenous contrast was administered. COMPARISON:  Cervical spine CT 1626 hours today. FINDINGS: Alignment: Stable straightening of cervical lordosis. Vertebrae: No marrow edema or evidence of acute osseous abnormality. Visualized bone marrow signal is within normal limits. Congenital incomplete segmentation of C5-C6 again noted. Cord: a no cervical spinal cord signal abnormality despite multilevel spinal stenosis with cord mass effect, detailed below. No evidence of spinal cord blood products on axial T2 * imaging. Posterior Fossa, vertebral arteries, paraspinal tissues: Cervicomedullary junction is within normal limits. Grossly negative visible brain parenchyma. Preserved major vascular flow voids in the neck. The right vertebral artery appears dominant. Abnormal prevertebral fluid or signal at the C5-C6 level (series 9, image 32 and series 7, image 7). But no other signal abnormality identified in the cervical spine ligamentous complex. Disc levels: C2-C3: Mild endplate spurring, posterior element hypertrophy. No stenosis. C3-C4: Disc space loss with bulky circumferential disc osteophyte complex. Small superimposed central component of  disc. Moderate spinal stenosis (series 10, image 21) and spinal cord mass effect. Severe left and moderate right C4 foraminal stenosis. C4-C5: Circumferential disc bulge and endplate spurring with the lobulated posterior component (series 10, image 25). Mild spinal stenosis and spinal cord mass effect. Moderate to severe left C5 foraminal  stenosis. C5-C6:  Incompletely segmented. No stenosis. C6-C7: Disc space loss with circumferential disc osteophyte complex eccentric to the right. Borderline to mild spinal stenosis. No cord mass effect. Mild to moderate left and moderate to severe right C7 foraminal stenosis. C7-T1: Mild disc bulging eccentric to the left. Mild facet hypertrophy. No stenosis. Negative visible upper thoracic levels. IMPRESSION: 1. 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 But no other ligamentous injury identified in the cervical spine. 2. Advanced disc and endplate degeneration at C3-C4, C4-C5 with up to moderate spinal stenosis and spinal cord mass effect. No cord signal abnormality. Borderline to mild spinal stenosis at C6-C7. Moderate or severe degenerative neural foraminal stenosis also at the left C4, left C5 and right C7 nerve levels. Electronically Signed: By: Genevie Ann M.D. On: 05/21/2020 19:51   DG Humerus Right  Result Date: 05/21/2020 CLINICAL DATA:  Fall from horse. Diffuse right upper extremity pain from the shoulder to the wrist. EXAM: RIGHT HUMERUS - 2+ VIEW COMPARISON:  None. FINDINGS: The cortical margins of the humerus are intact. There is no evidence of fracture or other focal bone lesions. Soft tissues are unremarkable. IMPRESSION: Negative radiographs of the right humerus. Electronically Signed   By: Keith Rake M.D.   On: 05/21/2020 17:09    Procedures Procedures (including critical care time)  Medications Ordered in ED Medications  fentaNYL (SUBLIMAZE) injection 50 mcg (50 mcg Intravenous Given  05/21/20 1856)  ondansetron (ZOFRAN) injection 4 mg (4 mg Intravenous Given 05/21/20 1616)  ketorolac (TORADOL) 30 MG/ML injection 15 mg (15 mg Intravenous Given 05/21/20 1856)  dexamethasone (DECADRON) injection 10 mg (10 mg Intravenous Given 05/21/20 2156)    ED Course  I have reviewed the triage vital signs and the nursing notes.  Pertinent labs & imaging results that were available during my care of the patient were reviewed by me and considered in my medical decision making (see chart for details).     Previously well female presents after fall from horse with transient paralysis, and persistent right arm pain.  Differential including cervical spine fracture, anterior cord contusion, intracranial injury, as well as posttraumatic injuries including the arm considered, CT scans, x-rays ordered after arrival, pain medication continued.  Update:, CT cervical spine demonstrates multiple chronic areas, no acute new fracture, some evidence for mild narrowing.  With her persistent right arm pain, inability to move, MRI ordered.  Update:, Patient in similar condition, now accompanied by her husband, we discussed all findings thus far.   10:40 PM Have discussed patient case with our neurosurgical colleague, Dr. Ellene Route, and he has seen and evaluated the patient as well. With consideration of her persistent discomfort, the severity of her injury, and abnormal cervical spine CT scan, and ligamentous injury on MRI, patient will require outpatient neurosurgery follow-up.  Patient will be started on gabapentin, steroids, per recommendation, and the patient requests additional pain medication which was provided as well. Following a lengthy conversation about these, the patient was discharged in stable condition with anticipated surgical repair.  MDM Rules/Calculators/A&P MDM Number of Diagnoses or Management Options Acute traumatic injury of cervical spine ([place]): new, needed workup Fall, initial encounter:  new, needed workup   Amount and/or Complexity of Data Reviewed Clinical lab tests: reviewed Tests in the radiology section of CPT: reviewed Tests in the medicine section of CPT: reviewed Discussion of test results with the performing providers: yes Obtain history from someone other than the patient: yes Discuss  the patient with other providers: yes Independent visualization of images, tracings, or specimens: yes  Risk of Complications, Morbidity, and/or Mortality Presenting problems: high Diagnostic procedures: high Management options: high  Critical Care Total time providing critical care: 30-74 minutes (35)  Patient Progress Patient progress: stable  Final Clinical Impression(s) / ED Diagnoses Final diagnoses:  Fall, initial encounter  Acute traumatic injury of cervical spine ([place])    Rx / DC Orders ED Discharge Orders         Ordered    gabapentin (NEURONTIN) 300 MG capsule  3 times daily        05/21/20 2232    methylPREDNISolone (MEDROL DOSEPAK) 4 MG TBPK tablet        05/21/20 2232    HYDROcodone-acetaminophen (NORCO/VICODIN) 5-325 MG tablet  Every 6 hours PRN        05/21/20 2232           Carmin Muskrat, MD 05/21/20 2242

## 2020-09-14 ENCOUNTER — Encounter (HOSPITAL_COMMUNITY): Payer: Self-pay | Admitting: Emergency Medicine

## 2020-09-14 DIAGNOSIS — R0789 Other chest pain: Secondary | ICD-10-CM | POA: Diagnosis not present

## 2020-09-14 DIAGNOSIS — Z5321 Procedure and treatment not carried out due to patient leaving prior to being seen by health care provider: Secondary | ICD-10-CM | POA: Diagnosis not present

## 2020-09-14 DIAGNOSIS — R002 Palpitations: Secondary | ICD-10-CM | POA: Diagnosis not present

## 2020-09-14 LAB — I-STAT BETA HCG BLOOD, ED (MC, WL, AP ONLY): I-stat hCG, quantitative: <5 m[IU]/mL (ref <5)

## 2020-09-14 LAB — CBC
HCT: 37.4 % (ref 36.0–46.0)
Hemoglobin: 12.5 g/dL (ref 12.0–15.0)
MCH: 30.3 pg (ref 26.0–34.0)
MCHC: 33.4 g/dL (ref 30.0–36.0)
MCV: 90.6 fL (ref 80.0–100.0)
Platelets: 287 10*3/uL (ref 150–400)
RBC: 4.13 MIL/uL (ref 3.87–5.11)
RDW: 12 % (ref 11.5–15.5)
WBC: 6.8 10*3/uL (ref 4.0–10.5)
nRBC: 0 % (ref 0.0–0.2)

## 2020-09-14 LAB — BASIC METABOLIC PANEL
Anion gap: 10 (ref 5–15)
BUN: 11 mg/dL (ref 6–20)
CO2: 25 mmol/L (ref 22–32)
Calcium: 9.8 mg/dL (ref 8.9–10.3)
Chloride: 103 mmol/L (ref 98–111)
Creatinine, Ser: 0.92 mg/dL (ref 0.44–1.00)
GFR, Estimated: >60 mL/min (ref >60)
Glucose, Bld: 108 mg/dL — ABNORMAL HIGH (ref 70–99)
Potassium: 2.8 mmol/L — ABNORMAL LOW (ref 3.5–5.1)
Sodium: 138 mmol/L (ref 135–145)

## 2020-09-14 LAB — TROPONIN I (HIGH SENSITIVITY): Troponin I (High Sensitivity): <2 ng/L (ref <18)

## 2020-09-14 NOTE — ED Triage Notes (Signed)
Patient reports "chest heaviness" for a couple of days with palpitations this evening. Reports taking 3101m aspirin PTA.

## 2020-09-15 ENCOUNTER — Emergency Department (HOSPITAL_COMMUNITY)
Admission: EM | Admit: 2020-09-15 | Discharge: 2020-09-15 | Disposition: A | Discharge status: Home / Self Care | Payer: Commercial Managed Care - PPO | Attending: Emergency Medicine | Admitting: Emergency Medicine

## 2020-09-15 NOTE — ED Notes (Signed)
NT reported pt has been called multiple times without answer. Will be removed from system

## 2020-10-07 ENCOUNTER — Other Ambulatory Visit: Payer: Self-pay | Admitting: Family Medicine

## 2020-10-07 DIAGNOSIS — Z1231 Encounter for screening mammogram for malignant neoplasm of breast: Secondary | ICD-10-CM

## 2020-11-24 ENCOUNTER — Ambulatory Visit: Payer: Commercial Managed Care - PPO

## 2021-01-30 ENCOUNTER — Ambulatory Visit
Admission: RE | Admit: 2021-01-30 | Discharge: 2021-01-30 | Disposition: A | Discharge status: Home / Self Care | Payer: Commercial Managed Care - PPO | Source: Ambulatory Visit | Attending: Family Medicine | Admitting: Family Medicine

## 2021-01-30 ENCOUNTER — Other Ambulatory Visit: Payer: Self-pay

## 2021-01-30 DIAGNOSIS — Z1231 Encounter for screening mammogram for malignant neoplasm of breast: Secondary | ICD-10-CM

## 2021-02-02 ENCOUNTER — Ambulatory Visit: Payer: Commercial Managed Care - PPO

## 2021-03-04 LAB — EXTERNAL GENERIC LAB PROCEDURE: COLOGUARD: NEGATIVE

## 2021-03-04 LAB — COLOGUARD: COLOGUARD: NEGATIVE

## 2021-04-26 ENCOUNTER — Ambulatory Visit: Payer: Commercial Managed Care - PPO | Admitting: Family Medicine

## 2021-04-26 ENCOUNTER — Encounter: Payer: Self-pay | Admitting: Family Medicine

## 2021-04-26 ENCOUNTER — Ambulatory Visit: Payer: Self-pay

## 2021-04-26 ENCOUNTER — Other Ambulatory Visit: Payer: Self-pay

## 2021-04-26 VITALS — BP 140/80 | Ht 63.0 in | Wt 128.0 lb

## 2021-04-26 DIAGNOSIS — M25551 Pain in right hip: Secondary | ICD-10-CM | POA: Insufficient documentation

## 2021-04-26 DIAGNOSIS — S46011A Strain of muscle(s) and tendon(s) of the rotator cuff of right shoulder, initial encounter: Secondary | ICD-10-CM | POA: Insufficient documentation

## 2021-04-26 DIAGNOSIS — M25511 Pain in right shoulder: Secondary | ICD-10-CM

## 2021-04-26 MED ORDER — MELOXICAM 7.5 MG PO TABS: 7.5000 mg | ORAL_TABLET | Freq: Two times a day (BID) | ORAL | 1 refills | Status: DC | PRN

## 2021-04-26 NOTE — Assessment & Plan Note (Signed)
Occurring from a traumatic event.  She had imaging done 9/9 of last year which was normal.  Ultrasound was demonstrating significant rotator cuff tear. -Counseled on home exercise therapy and supportive care. -Counseled on compression. -MRI to evaluate for rotator cuff tear

## 2021-04-26 NOTE — Patient Instructions (Signed)
Nice to meet you Please try the sling  Please try heat  Please schedule the MRI at [phone] Please try the hip exercises   Please send me a message in MyChart with any questions or updates.  We will schedule a virtual visit once the mRI is results .   --Dr. Raeford Razor

## 2021-04-26 NOTE — Assessment & Plan Note (Signed)
Having pain more laterally that seems to be more of a greater trochanteric pain.  Seems less likely to involve the joint at this time. -Counseled on home exercise therapy and supportive care. -Meloxicam. -Could consider injection.

## 2021-04-26 NOTE — Progress Notes (Signed)
Erika Sloan - 50 y.o. female MRN [id number]  Date of birth: August 21, 1971  SUBJECTIVE:  Including CC & ROS.  No chief complaint on file.   Erika Sloan is a 50 y.o. female that is presenting with right shoulder pain and right hip pain.  Shoulder pain has been present for about 9 months.  She has had about 3 falls off of a horse.  She has had significant pain that has gotten worse over the past few weeks at the shoulder.  Having anterior and lateral hip pain.  Mild in nature.  Seems to be worse if she lies on the affected side..   Review of Systems See HPI   HISTORY: Past Medical, Surgical, Social, and Family History Reviewed & Updated per EMR.   Pertinent Historical Findings include:  Past Medical History:  Diagnosis Date   Anemia    Heart murmur    07-12-2018  per pt has told at times murmur heard, but is asymptomatic   Wears glasses     Past Surgical History:  Procedure Laterality Date   DILITATION & CURRETTAGE/HYSTROSCOPY WITH HYDROTHERMAL ABLATION N/A 02/15/2017   Procedure: DILATATION & CURETTAGE/HYSTEROSCOPY WITH HYDROTHERMAL ABLATION;  Surgeon: Emily Filbert, MD;  Location: [facility];  Service: Gynecology;  Laterality: N/A;   HERNIA REPAIR  2005   LAPAROSCOPIC CHOLECYSTECTOMY  2008 approx.   LAPAROSCOPY N/A 02/15/2017   Procedure: LAPAROSCOPY OPERATIVE WITH PERITONEAL BIOPSY;  Surgeon: Emily Filbert, MD;  Location: [facility];  Service: Gynecology;  Laterality: N/A;   SIGMOIDOSCOPY     UMBILICAL HERNIA REPAIR  2005   UNILATERAL SALPINGECTOMY Bilateral 07/18/2018   Procedure: BILATERAL SALPINGECTOMY;  Surgeon: Ena Dawley, MD;  Location: [facility];  Service: Gynecology;  Laterality: Bilateral;   UPPER GI ENDOSCOPY     VAGINAL HYSTERECTOMY Bilateral 07/18/2018   Procedure: HYSTERECTOMY VAGINAL;  Surgeon: Ena Dawley, MD;  Location: [facility];  Service: Gynecology;  Laterality: Bilateral;    Family History  Problem Relation Age of Onset    Breast cancer Mother    Mitral valve prolapse Mother     Social History   Socioeconomic History   Marital status: Married    Spouse name: Not on file   Number of children: Not on file   Years of education: Not on file   Highest education level: Not on file  Occupational History   Occupation: LPN  Tobacco Use   Smoking status: Never   Smokeless tobacco: Never  Vaping Use   Vaping Use: Never used  Substance and Sexual Activity   Alcohol use: Yes    Comment: occasional   Drug use: Never   Sexual activity: Yes    Partners: Female    Birth control/protection: Surgical    Comment: partner vasectomy  Other Topics Concern   Not on file  Social History Narrative   Not on file   Social Determinants of Health   Financial Resource Strain: Not on file  Food Insecurity: Not on file  Transportation Needs: Not on file  Physical Activity: Not on file  Stress: Not on file  Social Connections: Not on file  Intimate Partner Violence: Not on file     PHYSICAL EXAM:  VS: BP 140/80 (BP Location: Left Arm, Patient Position: Sitting, Cuff Size: Normal)   Ht 5' 3"  (1.6 m)   Wt 128 lb (58.1 kg)   LMP 06/14/2018 (Approximate) Comment: per pt when has periods only spotting since ablation 06/ 2018  BMI 22.67 kg/m  Physical Exam Gen: NAD, alert, cooperative with exam, well-appearing MSK:  Right shoulder: Normal range of motion. Weakness with empty can testing. Neurovascular intact  Limited ultrasound: Right shoulder, right hip:  Right hip: Normal-appearing biceps tendon. Normal-appearing subscapularis with an effusion overlying to suggest fluid from the joint. Complete tear of the supraspinatus appreciated with associated large effusion  Right hip: No effusion within the joint itself. Increased hyperemia near the ASIS.   Summary: Rotator cuff tear and irritation of the tensor fascia  Ultrasound and interpretation by Clearance Coots, MD    ASSESSMENT & PLAN:   Traumatic  complete tear of right rotator cuff Occurring from a traumatic event.  She had imaging done 9/9 of last year which was normal.  Ultrasound was demonstrating significant rotator cuff tear. -Counseled on home exercise therapy and supportive care. -Counseled on compression. -MRI to evaluate for rotator cuff tear  Greater trochanteric pain syndrome of right lower extremity Having pain more laterally that seems to be more of a greater trochanteric pain.  Seems less likely to involve the joint at this time. -Counseled on home exercise therapy and supportive care. -Meloxicam. -Could consider injection.

## 2021-05-03 ENCOUNTER — Ambulatory Visit
Admission: RE | Admit: 2021-05-03 | Discharge: 2021-05-03 | Disposition: A | Discharge status: Home / Self Care | Payer: Commercial Managed Care - PPO | Source: Ambulatory Visit | Attending: Family Medicine | Admitting: Family Medicine

## 2021-05-03 ENCOUNTER — Encounter: Payer: Self-pay | Admitting: *Deleted

## 2021-05-03 ENCOUNTER — Other Ambulatory Visit: Payer: Self-pay

## 2021-05-03 DIAGNOSIS — S46011A Strain of muscle(s) and tendon(s) of the rotator cuff of right shoulder, initial encounter: Secondary | ICD-10-CM

## 2021-05-05 ENCOUNTER — Other Ambulatory Visit: Payer: Commercial Managed Care - PPO

## 2021-05-06 ENCOUNTER — Telehealth (INDEPENDENT_AMBULATORY_CARE_PROVIDER_SITE_OTHER): Payer: Commercial Managed Care - PPO | Admitting: Family Medicine

## 2021-05-06 ENCOUNTER — Encounter: Payer: Self-pay | Admitting: Family Medicine

## 2021-05-06 ENCOUNTER — Other Ambulatory Visit: Payer: Self-pay

## 2021-05-06 DIAGNOSIS — S46011D Strain of muscle(s) and tendon(s) of the rotator cuff of right shoulder, subsequent encounter: Secondary | ICD-10-CM

## 2021-05-06 MED ORDER — NITROGLYCERIN 0.2 MG/HR TD PT24: MEDICATED_PATCH | TRANSDERMAL | 11 refills | Status: AC

## 2021-05-06 NOTE — Progress Notes (Signed)
Virtual Visit via Video Note  I connected with Erika Sloan on 05/06/21 at  8:00 AM EDT by a video enabled telemedicine application and verified that I am speaking with the correct person using two identifiers.  Location: Patient: home Provider: office   I discussed the limitations of evaluation and management by telemedicine and the availability of in person appointments. The patient expressed understanding and agreed to proceed.  History of Present Illness:  [name] is a 50 year old female that is following up after the MRI of her right shoulder.  This was demonstrating moderate tendinosis of the supraspinatus tendon with a large high-grade partial-thickness near complete bursal surface tear.   Observations/Objective:  Gen: NAD, alert, cooperative with exam, well-appearing  Assessment and Plan:  Traumatic supraspinatus tear: MRI confirming supraspinatus tear.  - counseled on home exercise therapy and supportive  - nitro patches  - referral to PT  - pursue shockwave   Follow Up Instructions:    I discussed the assessment and treatment plan with the patient. The patient was provided an opportunity to ask questions and all were answered. The patient agreed with the plan and demonstrated an understanding of the instructions.   The patient was advised to call back or seek an in-person evaluation if the symptoms worsen or if the condition fails to improve as anticipated.    Clearance Coots, MD

## 2021-05-06 NOTE — Assessment & Plan Note (Signed)
MRI confirming supraspinatus tear.  - counseled on home exercise therapy and supportive  - nitro patches  - referral to PT  - pursue shockwave

## 2021-06-24 ENCOUNTER — Other Ambulatory Visit (HOSPITAL_BASED_OUTPATIENT_CLINIC_OR_DEPARTMENT_OTHER): Payer: Self-pay

## 2021-06-24 MED ORDER — INFLUENZA VAC SPLIT QUAD 0.5 ML IM SUSY
PREFILLED_SYRINGE | INTRAMUSCULAR | 0 refills | Status: AC
  Filled 2021-06-24: qty 0.5, 1d supply, fill #0

## 2021-07-23 ENCOUNTER — Other Ambulatory Visit: Payer: Self-pay | Admitting: Family Medicine

## 2021-07-23 DIAGNOSIS — R3129 Other microscopic hematuria: Secondary | ICD-10-CM

## 2021-08-13 ENCOUNTER — Other Ambulatory Visit (HOSPITAL_COMMUNITY): Payer: Self-pay

## 2021-08-13 ENCOUNTER — Other Ambulatory Visit: Payer: Commercial Managed Care - PPO

## 2021-08-18 ENCOUNTER — Other Ambulatory Visit: Payer: Self-pay | Admitting: *Deleted

## 2021-08-18 MED ORDER — MELOXICAM 7.5 MG PO TABS: 7.5000 mg | ORAL_TABLET | Freq: Two times a day (BID) | ORAL | 1 refills | Status: AC | PRN

## 2021-08-30 ENCOUNTER — Ambulatory Visit
Admission: RE | Admit: 2021-08-30 | Discharge: 2021-08-30 | Disposition: A | Discharge status: Home / Self Care | Payer: Commercial Managed Care - PPO | Source: Ambulatory Visit | Attending: Family Medicine | Admitting: Family Medicine

## 2021-08-30 DIAGNOSIS — R3129 Other microscopic hematuria: Secondary | ICD-10-CM

## 2021-08-30 MED ORDER — IOPAMIDOL (ISOVUE-300) INJECTION 61%
100.0000 mL | Freq: Once | INTRAVENOUS | Status: AC | PRN
  Administered 2021-08-30: 16:00:00 100 mL via INTRAVENOUS

## 2021-09-11 IMAGING — CR DG PELVIS 1-2V
1 series · 1 of 1 positions shown · non-contrast
Comparison: None.

CLINICAL DATA: Fall from horse landing on right side.

EXAM:
PELVIS - 1-2 VIEW

[pelvis ap]
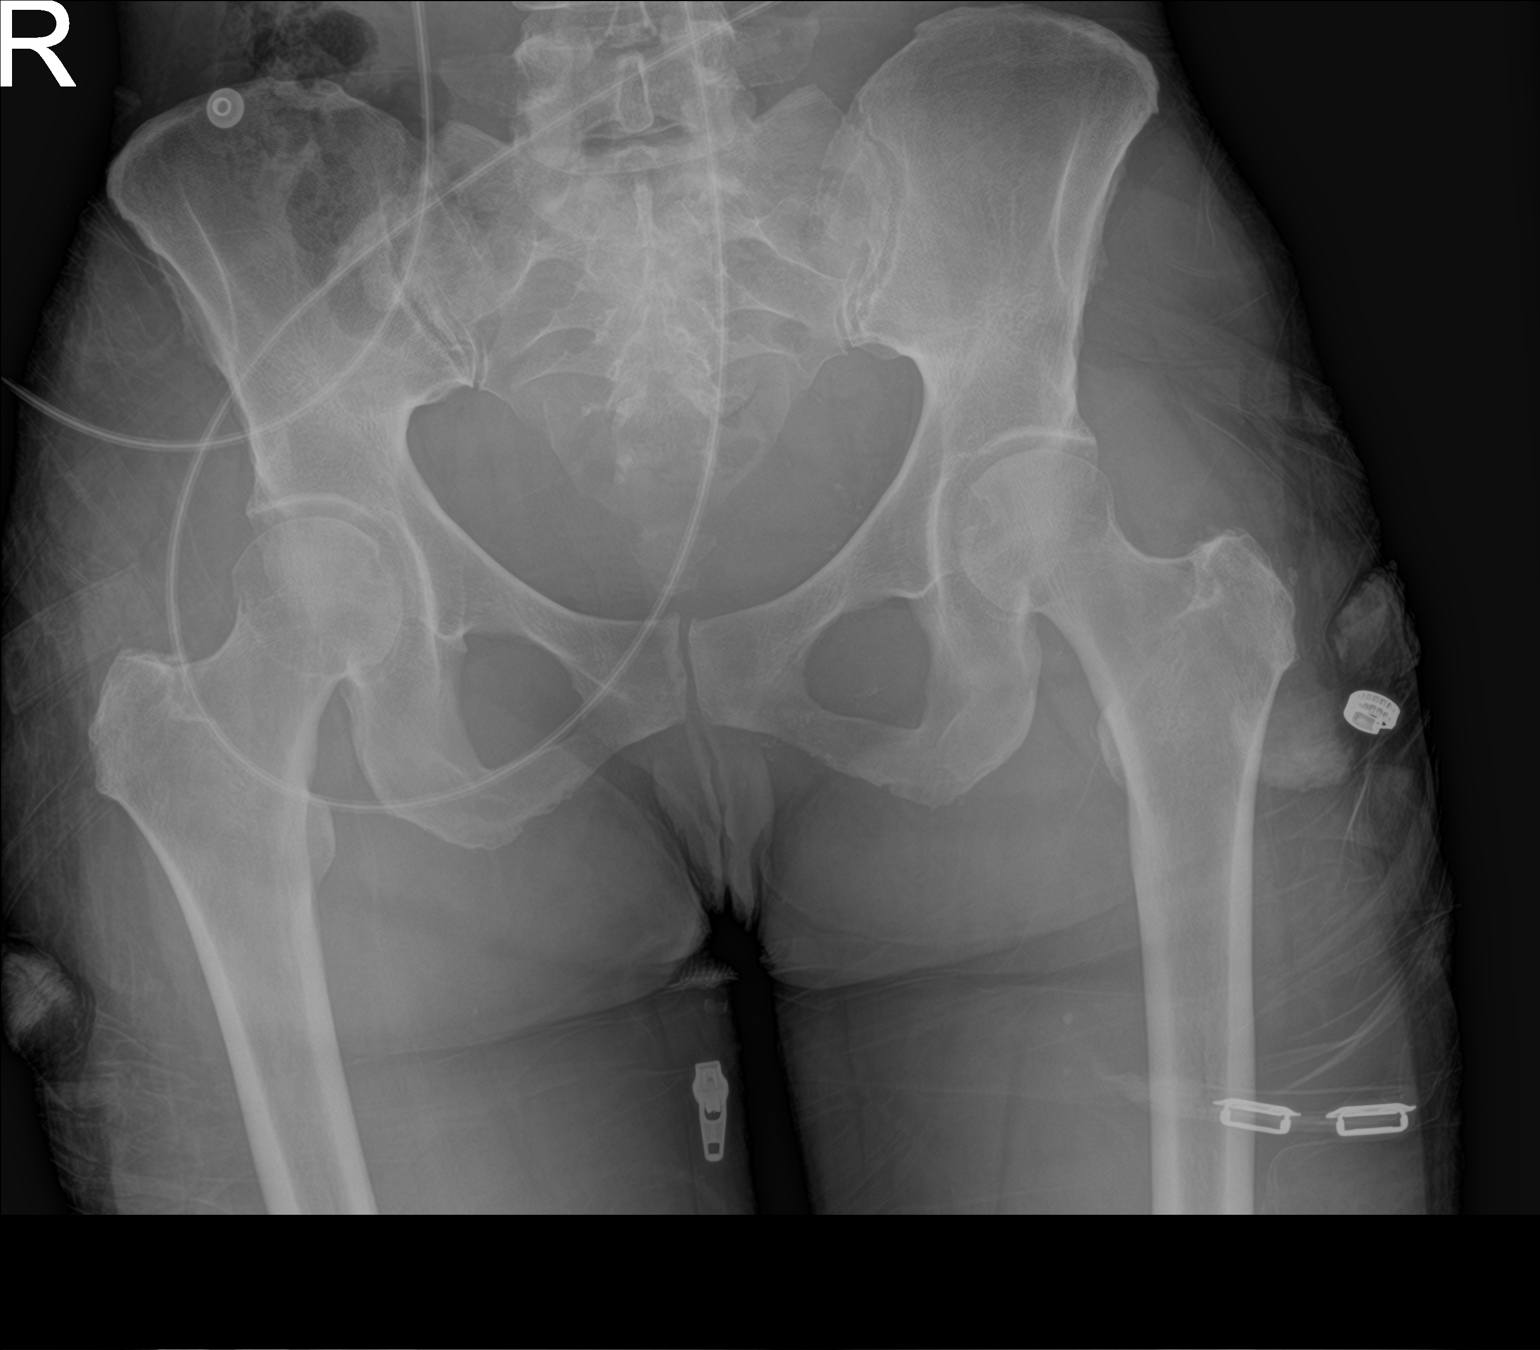

[1 of 1 positions shown; findings below may reference images not displayed]

FINDINGS: The cortical margins of the bony pelvis are intact. No fracture.
Pubic symphysis and sacroiliac joints are congruent. Both femoral
heads are well-seated in the respective acetabula.
IMPRESSION: No pelvic fracture.

## 2021-09-11 IMAGING — MR MR CERVICAL SPINE W/O CM
6 series · 30 of 48 positions shown · non-contrast
Comparison: Cervical spine CT 2676 hours today.
COMPARISON: Cervical spine CT 2676 hours today.

Addendum:
CLINICAL DATA: 49-year-old female status post fall from horse. Neck
pain. Moderate to severe spinal stenosis suspected on cervical spine
CT earlier today.

EXAM:
MRI CERVICAL SPINE WITHOUT CONTRAST
TECHNIQUE: Multiplanar, multisequence MR imaging of the cervical spine was
performed. No intravenous contrast was administered.

[Series 5: T2 · sagittal · 3.0mm · 0.69mm/px · 5 of 15 slices shown (1 of 2)]
[im 1/15]
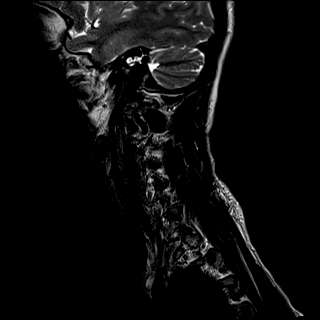
[im 4/15]
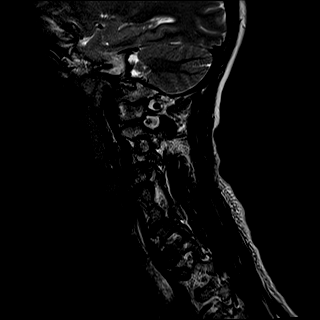
[im 8/15]
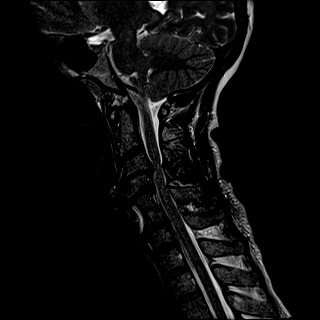
[im 11/15]
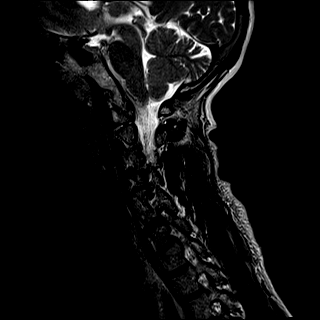
[im 15/15]
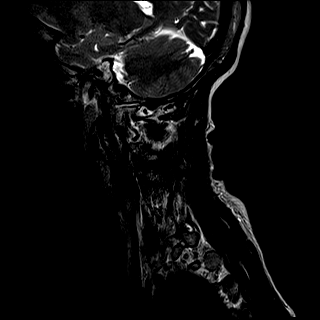

[Series 6: T1 · sagittal · 3.0mm · 0.69mm/px · 5 of 15 slices shown]
[im 1/15]
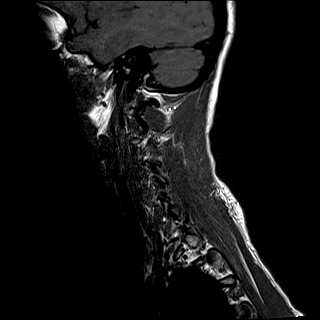
[im 4/15]
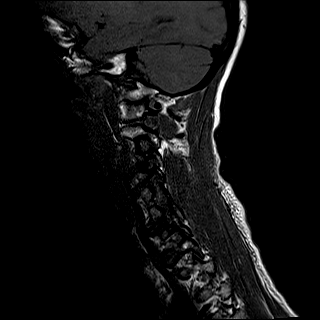
[im 8/15]
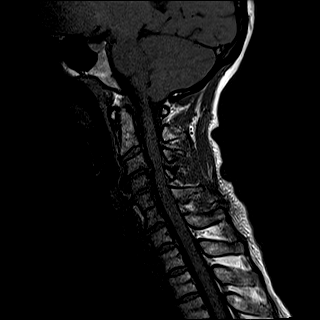
[im 11/15]
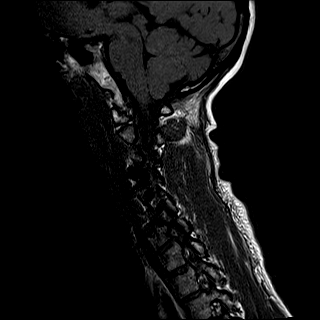
[im 15/15]
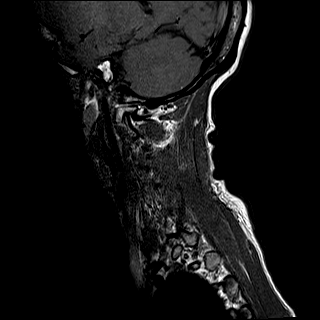

[Series 7: STIR · sagittal · 3.0mm · 0.86mm/px · 5 of 15 slices shown]
[im 1/15]
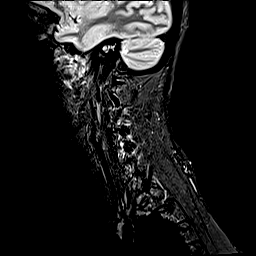
[im 4/15]
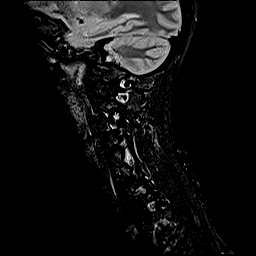
[im 8/15]
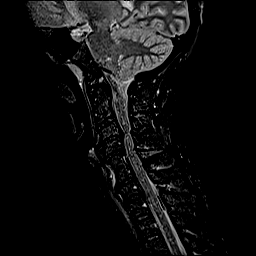
[im 11/15]
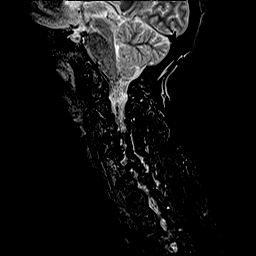
[im 15/15]
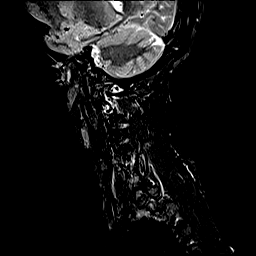

[Series 8: GRE · sagittal · 3.0mm · 0.39mm/px · 5 of 15 slices shown (1 of 2)]
[im 1/15]
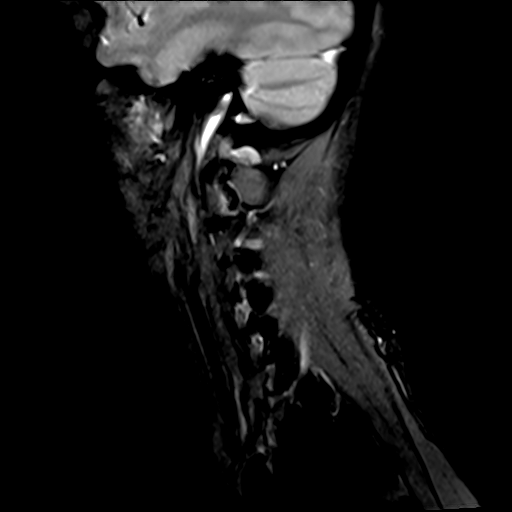
[im 4/15]
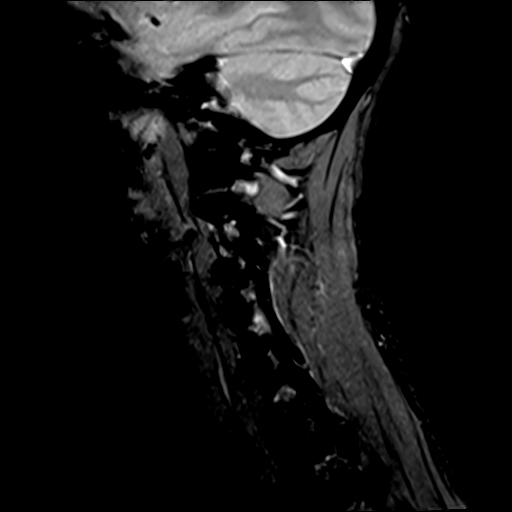
[im 8/15]
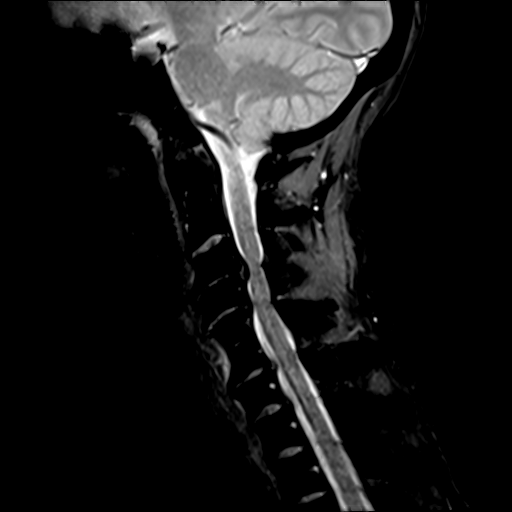
[im 11/15]
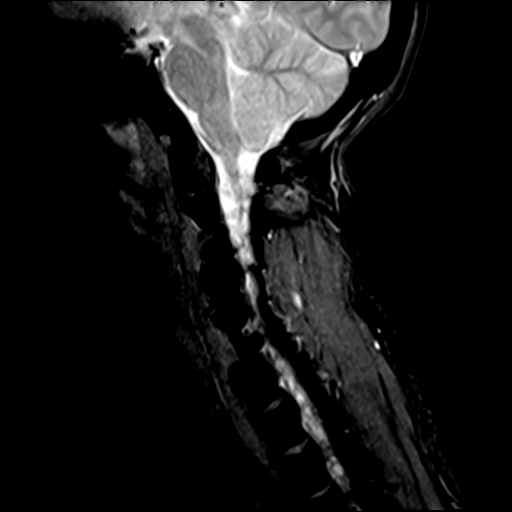
[im 15/15]
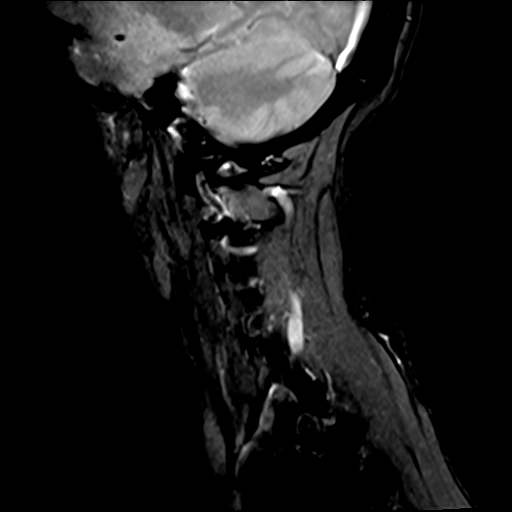

[Series 9: T2 · axial · 3.0mm · 0.66mm/px · z∈[-133,-13]mm · 8 of 40 slices shown (2 of 2)]
[im 1/40]
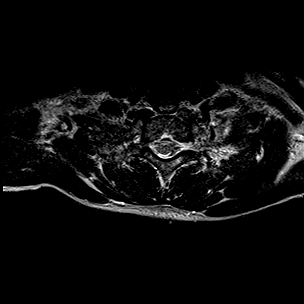
[im 7/40]
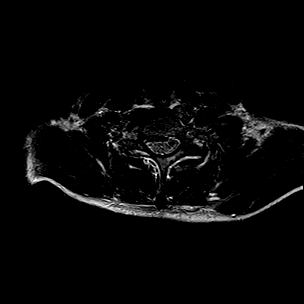
[im 13/40]
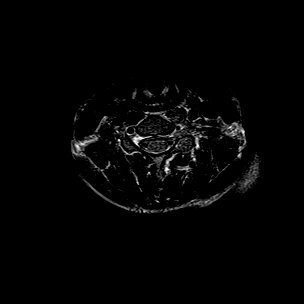
[im 19/40]
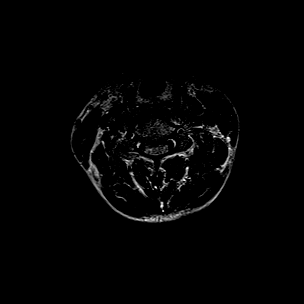
[im 22/40]
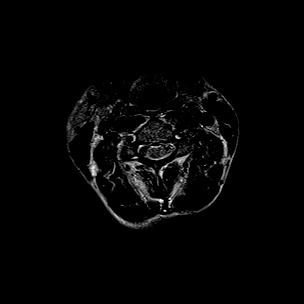
[im 28/40]
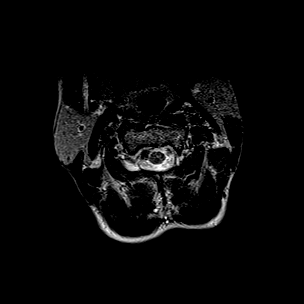
[im 34/40]
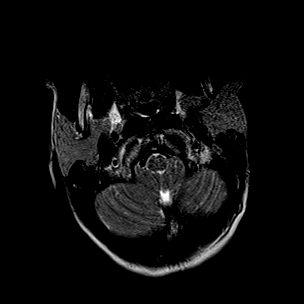
[im 40/40]
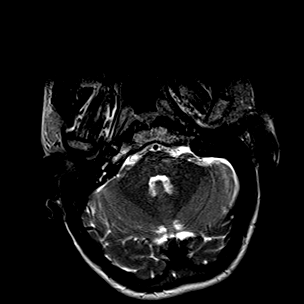

[Series 10: GRE · axial · 3.0mm · 0.39mm/px · z∈[-130,-112]mm · 2 of 40 slices shown (2 of 2)]
[im 1/40]
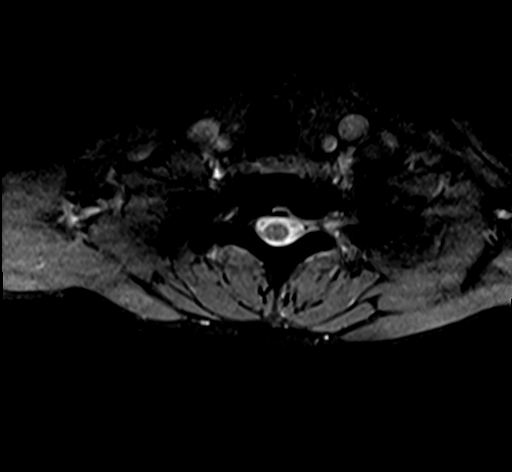
[im 7/40]
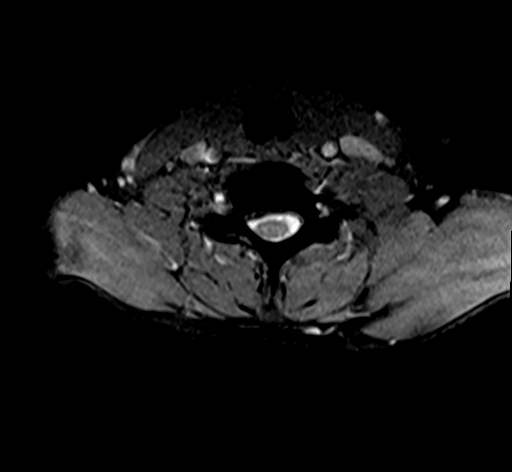

[30 of 48 positions shown; findings below may reference images not displayed]

FINDINGS: Alignment: Stable straightening of cervical lordosis.

Vertebrae: No marrow edema or evidence of acute osseous abnormality.
Visualized bone marrow signal is within normal limits. Congenital
incomplete segmentation of C5-C6 again noted.

Cord: a no cervical spinal cord signal abnormality despite
multilevel spinal stenosis with cord mass effect, detailed below. No
evidence of spinal cord blood products on axial T2 * imaging.

Posterior Fossa, vertebral arteries, paraspinal tissues:
Cervicomedullary junction is within normal limits. Grossly negative
visible brain parenchyma.

Preserved major vascular flow voids in the neck. The right vertebral
artery appears dominant.

Abnormal prevertebral fluid or signal at the C5-C6 level (series 9,
image 32 and series 7, image 7). But no other signal abnormality
identified in the cervical spine ligamentous complex.

Disc levels:

C2-C3: Mild endplate spurring, posterior element hypertrophy. No
stenosis.

C3-C4: Disc space loss with bulky circumferential disc osteophyte
complex. Small superimposed central component of disc. Moderate
spinal stenosis (series 10, image 21) and spinal cord mass effect.
Severe left and moderate right C4 foraminal stenosis.

C4-C5: Circumferential disc bulge and endplate spurring with the
lobulated posterior component (series 10, image 25). Mild spinal
stenosis and spinal cord mass effect. Moderate to severe left C5
foraminal stenosis.

C5-C6:  Incompletely segmented. No stenosis.

C6-C7: Disc space loss with circumferential disc osteophyte complex
eccentric to the right. Borderline to mild spinal stenosis. No cord
mass effect. Mild to moderate left and moderate to severe right C7
foraminal stenosis.

C7-T1: Mild disc bulging eccentric to the left. Mild facet
hypertrophy. No stenosis.

Negative visible upper thoracic levels.
IMPRESSION: 1. Evidence of anterior ligamentous injury at the C5-C6 level where
there is congenital incomplete segmentation. But no other
ligamentous injury identified in the cervical spine.

2. Advanced disc and endplate degeneration at C3-C4, C4-C5 with up
to moderate spinal stenosis and spinal cord mass effect. No cord
signal abnormality. Borderline to mild spinal stenosis at C6-C7.
Moderate or severe degenerative neural foraminal stenosis also at
the left C4, left C5 and right C7 nerve levels.

ADDENDUM:
Study discussed by telephone with Dr. Rj Schutz on 05/21/2020 at
3102 hours. He advises that by report from the ED, the patient has
an acute right C5 deficit.

We discussed the moderate spinal cord mass effect at C3-C4, but on
further review of the exam I find no convincing signs of acute cord
contusion. Additionally, I see no secondary evidence of a right C5
nerve root avulsion (e.g. no extradural fluid or blood in the
cervical spine).

*** End of Addendum ***
FINDINGS: Alignment: Stable straightening of cervical lordosis.

Vertebrae: No marrow edema or evidence of acute osseous abnormality.
Visualized bone marrow signal is within normal limits. Congenital
incomplete segmentation of C5-C6 again noted.

Cord: a no cervical spinal cord signal abnormality despite
multilevel spinal stenosis with cord mass effect, detailed below. No
evidence of spinal cord blood products on axial T2 * imaging.

Posterior Fossa, vertebral arteries, paraspinal tissues:
Cervicomedullary junction is within normal limits. Grossly negative
visible brain parenchyma.

Preserved major vascular flow voids in the neck. The right vertebral
artery appears dominant.

Abnormal prevertebral fluid or signal at the C5-C6 level (series 9,
image 32 and series 7, image 7). But no other signal abnormality
identified in the cervical spine ligamentous complex.

Disc levels:

C2-C3: Mild endplate spurring, posterior element hypertrophy. No
stenosis.

C3-C4: Disc space loss with bulky circumferential disc osteophyte
complex. Small superimposed central component of disc. Moderate
spinal stenosis (series 10, image 21) and spinal cord mass effect.
Severe left and moderate right C4 foraminal stenosis.

C4-C5: Circumferential disc bulge and endplate spurring with the
lobulated posterior component (series 10, image 25). Mild spinal
stenosis and spinal cord mass effect. Moderate to severe left C5
foraminal stenosis.

C5-C6:  Incompletely segmented. No stenosis.

C6-C7: Disc space loss with circumferential disc osteophyte complex
eccentric to the right. Borderline to mild spinal stenosis. No cord
mass effect. Mild to moderate left and moderate to severe right C7
foraminal stenosis.

C7-T1: Mild disc bulging eccentric to the left. Mild facet
hypertrophy. No stenosis.

Negative visible upper thoracic levels.
IMPRESSION: 1. Evidence of anterior ligamentous injury at the C5-C6 level where
there is congenital incomplete segmentation. But no other
ligamentous injury identified in the cervical spine.

2. Advanced disc and endplate degeneration at C3-C4, C4-C5 with up
to moderate spinal stenosis and spinal cord mass effect. No cord
signal abnormality. Borderline to mild spinal stenosis at C6-C7.
Moderate or severe degenerative neural foraminal stenosis also at
the left C4, left C5 and right C7 nerve levels.

## 2021-09-11 IMAGING — CR DG CHEST 1V
1 series · 1 of 1 positions shown · non-contrast
Comparison: None.

CLINICAL DATA: Fall from horse. Diffuse right upper extremity pain
from the shoulder to the wrist.

EXAM:
CHEST  1 VIEW

[chest ap]
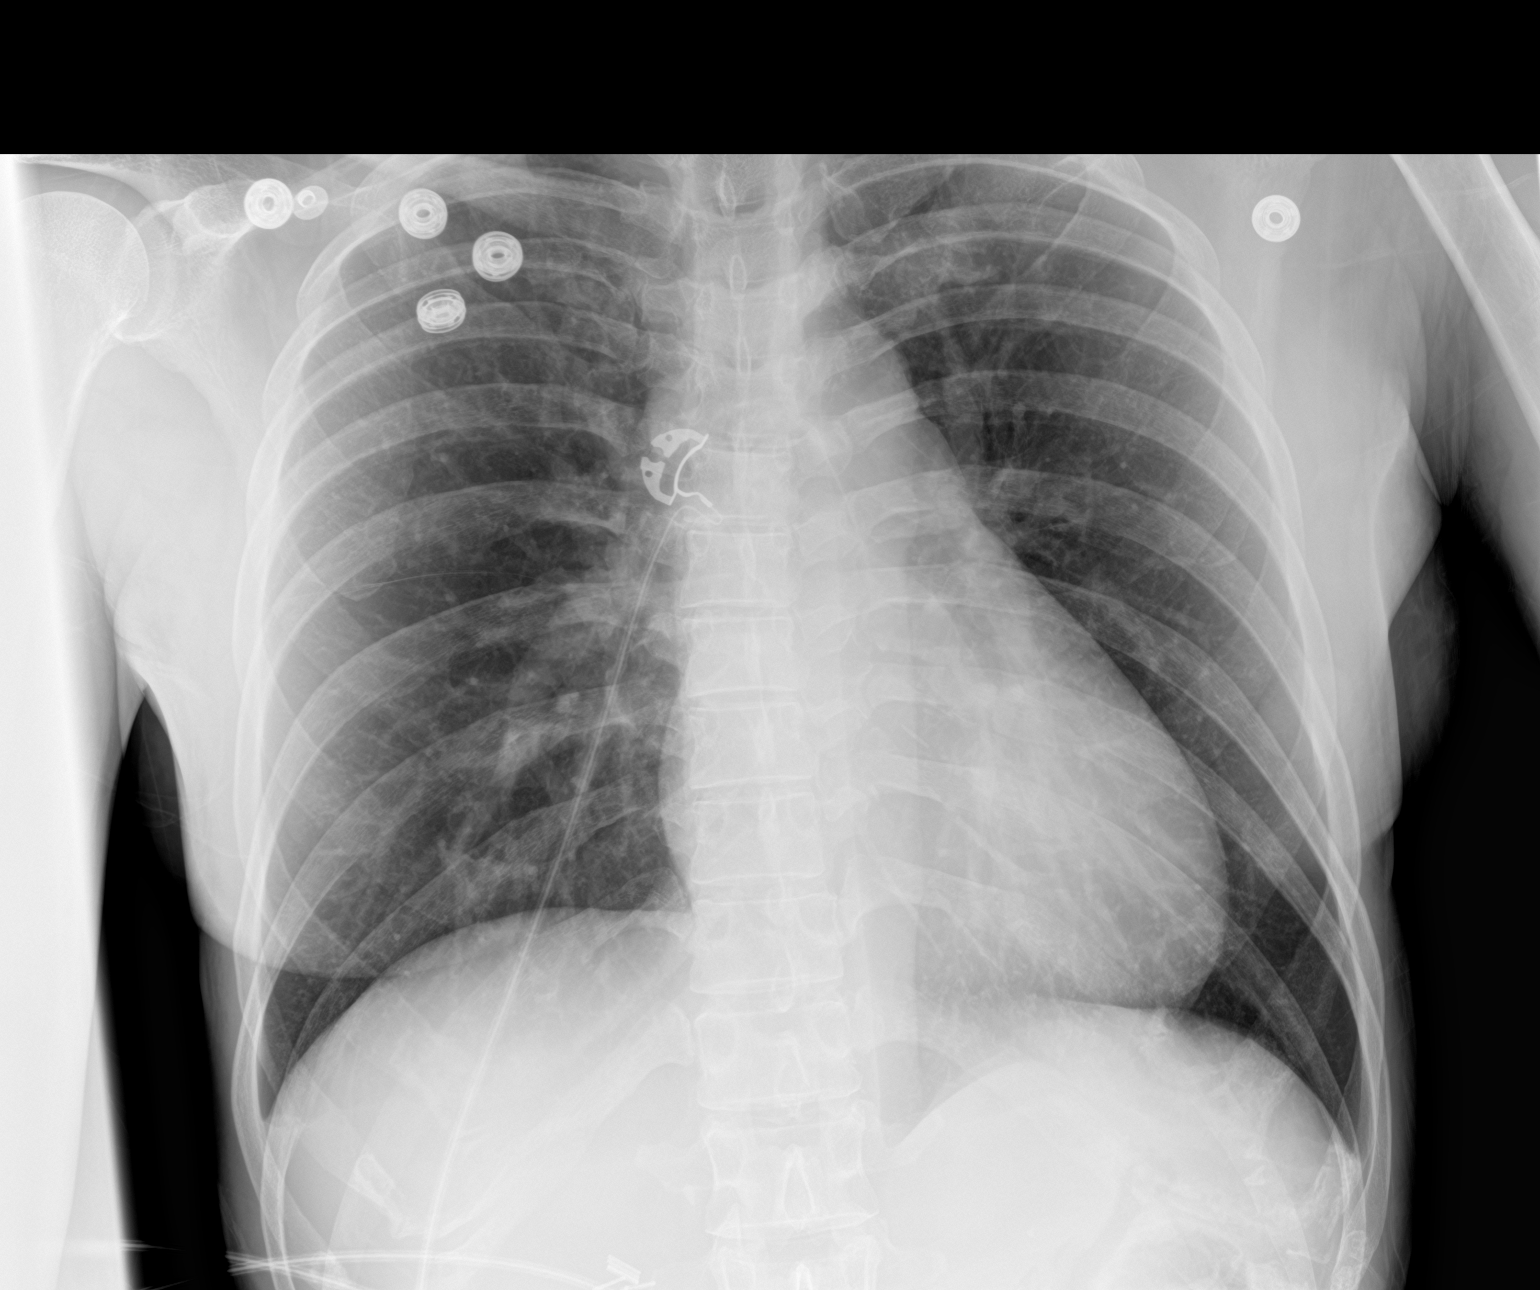

[1 of 1 positions shown; findings below may reference images not displayed]

FINDINGS: The cardiomediastinal contours are normal. The lungs are clear.
Pulmonary vasculature is normal. No consolidation, pleural effusion,
or pneumothorax. No acute osseous abnormalities are seen.
IMPRESSION: No acute findings or evidence of acute traumatic injury.

## 2022-05-11 ENCOUNTER — Other Ambulatory Visit: Payer: Self-pay | Admitting: Family Medicine

## 2022-05-11 DIAGNOSIS — Z1231 Encounter for screening mammogram for malignant neoplasm of breast: Secondary | ICD-10-CM

## 2022-08-03 ENCOUNTER — Ambulatory Visit: Payer: Commercial Managed Care - PPO

## 2022-09-09 ENCOUNTER — Ambulatory Visit: Payer: Commercial Managed Care - PPO

## 2022-10-27 ENCOUNTER — Ambulatory Visit
Admission: RE | Admit: 2022-10-27 | Discharge: 2022-10-27 | Disposition: A | Discharge status: Home / Self Care | Payer: Commercial Managed Care - PPO | Source: Ambulatory Visit | Attending: Family Medicine | Admitting: Family Medicine

## 2022-10-27 DIAGNOSIS — Z1231 Encounter for screening mammogram for malignant neoplasm of breast: Secondary | ICD-10-CM

## 2022-12-26 ENCOUNTER — Encounter: Payer: Self-pay | Admitting: *Deleted

## 2023-10-19 ENCOUNTER — Other Ambulatory Visit: Payer: Self-pay | Admitting: Family Medicine

## 2023-10-19 DIAGNOSIS — Z1231 Encounter for screening mammogram for malignant neoplasm of breast: Secondary | ICD-10-CM

## 2023-10-30 ENCOUNTER — Ambulatory Visit: Payer: Commercial Managed Care - PPO

## 2023-11-10 ENCOUNTER — Ambulatory Visit: Payer: Commercial Managed Care - PPO

## 2023-11-17 ENCOUNTER — Ambulatory Visit
Admission: RE | Admit: 2023-11-17 | Discharge: 2023-11-17 | Disposition: A | Discharge status: Home / Self Care | Payer: Commercial Managed Care - PPO | Source: Ambulatory Visit | Attending: Family Medicine | Admitting: Family Medicine

## 2023-11-17 DIAGNOSIS — Z1231 Encounter for screening mammogram for malignant neoplasm of breast: Secondary | ICD-10-CM

## 2024-07-08 ENCOUNTER — Emergency Department (HOSPITAL_BASED_OUTPATIENT_CLINIC_OR_DEPARTMENT_OTHER)

## 2024-07-08 ENCOUNTER — Other Ambulatory Visit: Payer: Self-pay

## 2024-07-08 ENCOUNTER — Encounter (HOSPITAL_BASED_OUTPATIENT_CLINIC_OR_DEPARTMENT_OTHER): Payer: Self-pay

## 2024-07-08 ENCOUNTER — Emergency Department (HOSPITAL_BASED_OUTPATIENT_CLINIC_OR_DEPARTMENT_OTHER)
Admission: EM | Admit: 2024-07-08 | Discharge: 2024-07-08 | Disposition: A | Discharge status: Home / Self Care | Attending: Emergency Medicine | Admitting: Emergency Medicine

## 2024-07-08 DIAGNOSIS — R1010 Upper abdominal pain, unspecified: Secondary | ICD-10-CM | POA: Insufficient documentation

## 2024-07-08 DIAGNOSIS — E876 Hypokalemia: Secondary | ICD-10-CM | POA: Insufficient documentation

## 2024-07-08 DIAGNOSIS — R109 Unspecified abdominal pain: Secondary | ICD-10-CM

## 2024-07-08 LAB — URINALYSIS, ROUTINE W REFLEX MICROSCOPIC
Bacteria, UA: NONE SEEN
Bilirubin Urine: NEGATIVE
Glucose, UA: NEGATIVE mg/dL
Ketones, ur: NEGATIVE mg/dL
Leukocytes,Ua: NEGATIVE
Nitrite: NEGATIVE
Protein, ur: NEGATIVE mg/dL
Specific Gravity, Urine: 1.01 (ref 1.005–1.030)
pH: 6 (ref 5.0–8.0)

## 2024-07-08 LAB — CBC
HCT: 35 % — ABNORMAL LOW (ref 36.0–46.0)
Hemoglobin: 12 g/dL (ref 12.0–15.0)
MCH: 30.8 pg (ref 26.0–34.0)
MCHC: 34.3 g/dL (ref 30.0–36.0)
MCV: 89.7 fL (ref 80.0–100.0)
Platelets: 287 K/uL (ref 150–400)
RBC: 3.9 MIL/uL (ref 3.87–5.11)
RDW: 11.9 % (ref 11.5–15.5)
WBC: 4.1 K/uL (ref 4.0–10.5)
nRBC: 0 % (ref 0.0–0.2)

## 2024-07-08 LAB — COMPREHENSIVE METABOLIC PANEL WITH GFR
ALT: 30 U/L (ref 0–44)
AST: 32 U/L (ref 15–41)
Albumin: 4.6 g/dL (ref 3.5–5.0)
Alkaline Phosphatase: 60 U/L (ref 38–126)
Anion gap: 11 (ref 5–15)
BUN: 9 mg/dL (ref 6–20)
CO2: 25 mmol/L (ref 22–32)
Calcium: 9.8 mg/dL (ref 8.9–10.3)
Chloride: 103 mmol/L (ref 98–111)
Creatinine, Ser: 0.79 mg/dL (ref 0.44–1.00)
GFR, Estimated: >60 mL/min (ref >60)
Glucose, Bld: 105 mg/dL — ABNORMAL HIGH (ref 70–99)
Potassium: 3.4 mmol/L — ABNORMAL LOW (ref 3.5–5.1)
Sodium: 139 mmol/L (ref 135–145)
Total Bilirubin: 0.5 mg/dL (ref 0.0–1.2)
Total Protein: 7.7 g/dL (ref 6.5–8.1)

## 2024-07-08 LAB — LIPASE, BLOOD: Lipase: 21 U/L (ref 11–51)

## 2024-07-08 NOTE — ED Provider Notes (Signed)
  EMERGENCY DEPARTMENT AT [facility] Provider Note   CSN: [id number] Arrival date & time: 07/08/24  1450     Patient presents with: Abdominal Pain and Nausea  HPI Erika Sloan is a 53 y.o. female with history of vaginal hysterectomy, cholecystectomy, reported history of incarcerated hernia presenting for upper abdominal pain.  Started acutely last night.  She states it woke her up from sleep and has been intermittent throughout the day.  Endorses nausea but no vomiting.  Also reports she is having diarrhea.  Last bowel movement was today.  Still passing gas.  She states the pain is similar to when she has had an incarcerated hernia in the past and was concerned that this could be causing her symptoms.  Denies any chest pain or shortness of breath.  Denies urinary or vaginal symptoms.    Abdominal Pain      Prior to Admission medications   Medication Sig Start Date End Date Taking? Authorizing Provider  BIOTIN PO Take 1 tablet by mouth daily with breakfast.    [provider]  gabapentin  (NEURONTIN ) 300 MG capsule Take 1 capsule (300 mg total) by mouth 3 (three) times daily for 10 days. 05/21/20 05/31/20  Garrick Charleston, MD  HYDROcodone -acetaminophen  (NORCO/VICODIN) 5-325 MG tablet Take 1 tablet by mouth every 6 (six) hours as needed for severe pain. 05/21/20   Garrick Charleston, MD  influenza vac split quadrivalent PF (FLUARIX) 0.5 ML injection Inject into the muscle. 06/24/21   Luiz Channel, MD  meloxicam  (MOBIC ) 7.5 MG tablet Take 1 tablet (7.5 mg total) by mouth 2 (two) times daily as needed. 08/18/21   Chick Venetia BRAVO, MD  methylPREDNISolone  (MEDROL  DOSEPAK) 4 MG TBPK tablet Please take the methylprednisolone  Dosepak as instructed on the packaging 05/21/20   Garrick Charleston, MD  nitroGLYCERIN  (NITRODUR - DOSED IN MG/24 HR) 0.2 mg/hr patch Cut and apply 1/4 patch to most painful area q24h. 05/06/21   Schmitz, Jeremy E, MD  tetrahydrozoline (VISINE) 0.05 %  ophthalmic solution Place 1 drop into both eyes as needed (for dry eyes).     [provider]  VITAMIN A PO Take 1 capsule by mouth 2 (two) times a week.    [provider]  Vitamin D, Ergocalciferol, (DRISDOL) 1.25 MG (50000 UNIT) CAPS capsule Take 50,000 Units by mouth every Saturday. 03/16/20   [provider]    Allergies: Oxycodone  and Adhesive [tape]    Review of Systems  Gastrointestinal:  Positive for abdominal pain.    Updated Vital Signs BP (!) 165/75   Pulse 65   Temp 98.1 F (36.7 C)   Resp 16   LMP 06/14/2018 (Approximate) Comment: per pt when has periods only spotting since ablation 06/ 2018  SpO2 100%   Physical Exam Vitals and nursing note reviewed.  HENT:     Head: Normocephalic and atraumatic.     Mouth/Throat:     Mouth: Mucous membranes are moist.  Eyes:     General:        Right eye: No discharge.        Left eye: No discharge.     Conjunctiva/sclera: Conjunctivae normal.  Cardiovascular:     Rate and Rhythm: Normal rate and regular rhythm.     Pulses: Normal pulses.     Heart sounds: Normal heart sounds.  Pulmonary:     Effort: Pulmonary effort is normal.     Breath sounds: Normal breath sounds.  Abdominal:     General: Abdomen is  flat.     Palpations: Abdomen is soft.     Tenderness: There is no abdominal tenderness.     Comments: No mass  Skin:    General: Skin is warm and dry.  Neurological:     General: No focal deficit present.  Psychiatric:        Mood and Affect: Mood normal.     (all labs ordered are listed, but only abnormal results are displayed) Labs Reviewed  COMPREHENSIVE METABOLIC PANEL WITH GFR - Abnormal; Notable for the following components:      Result Value   Potassium 3.4 (*)    Glucose, Bld 105 (*)    All other components within normal limits  CBC - Abnormal; Notable for the following components:   HCT 35.0 (*)    All other components within normal limits  URINALYSIS, ROUTINE W REFLEX  MICROSCOPIC - Abnormal; Notable for the following components:   Hgb urine dipstick SMALL (*)    All other components within normal limits  LIPASE, BLOOD    EKG: None  Radiology: CT ABDOMEN PELVIS WO CONTRAST Result Date: 07/08/2024 CLINICAL DATA:  Abdomen pain EXAM: CT ABDOMEN AND PELVIS WITHOUT CONTRAST TECHNIQUE: Multidetector CT imaging of the abdomen and pelvis was performed following the standard protocol without IV contrast. RADIATION DOSE REDUCTION: This exam was performed according to the departmental dose-optimization program which includes automated exposure control, adjustment of the mA and/or kV according to patient size and/or use of iterative reconstruction technique. COMPARISON:  CT 08/30/2021 FINDINGS: Lower chest: No acute abnormality. Hepatobiliary: No focal liver abnormality is seen. Status post cholecystectomy. No biliary dilatation. Pancreas: Unremarkable. No pancreatic ductal dilatation or surrounding inflammatory changes. Spleen: Normal in size without focal abnormality. Adrenals/Urinary Tract: Adrenal glands are unremarkable. Kidneys are normal, without renal calculi, focal lesion, or hydronephrosis. Bladder is unremarkable. Stomach/Bowel: Stomach is within normal limits. Appendix appears normal. No evidence of bowel wall thickening, distention, or inflammatory changes. Vascular/Lymphatic: Aortic atherosclerosis. No enlarged abdominal or pelvic lymph nodes. Reproductive: Status post hysterectomy. No adnexal masses. Other: No abdominal wall hernia or abnormality. No abdominopelvic ascites. Musculoskeletal: No acute or significant osseous findings. IMPRESSION: 1. No CT evidence for acute intra-abdominal or pelvic abnormality. 2. Aortic atherosclerosis. Aortic Atherosclerosis (ICD10-I70.0). Electronically Signed   By: Luke Bun M.D.   On: 07/08/2024 17:46     Procedures   Medications Ordered in the ED - No data to display                                  Medical  Decision Making Amount and/or Complexity of Data Reviewed Labs: ordered. Radiology: ordered.   53 year old well-appearing female presenting for abdominal pain.  Exam was unremarkable.  DDx includes acute cholecystitis kidney stone, bowel obstruction, incarcerated hernia, abdominal hernia, PE, other.  Labs showed mild hypokalemia at 3.4 but otherwise reassuring.  Overall she looks well with reassuring vitals.  CT is unremarkable.  On reassessment still without abdominal pain.  Advised her to follow-up with her PCP.  Discussed return precautions.  Discharged.     Final diagnoses:  Abdominal pain, unspecified abdominal location    ED Discharge Orders     None          Lang Norleen POUR, PA-C 07/08/24 1755    Jerrol Agent, MD 07/08/24 [phone]

## 2024-07-08 NOTE — Discharge Instructions (Addendum)
 Evaluation for your abdominal pain was reassuring.  CT scan was negative.  Please follow-up with your PCP.  If your abdominal pain returns, you have persistent nausea and vomiting, develop a fever, cannot have a bowel movement or any other concerning symptom please return to the ED for further evaluation.

## 2024-07-08 NOTE — ED Triage Notes (Signed)
 Pt c/o abd pain, RUQ dull, achy onset last night, woke me up from sleep, intemittent throughout the day. Nausea w episode of pain, poor PO PA at work palpated w pain/ soreness. Last BM today, I have diarrhea/ looser stools all the time.   Hx same/ similar presentation, incarcerated hernia.
